# Patient Record
Sex: Female | Born: 1960
Health system: Southern US, Community
[De-identification: ages and names within clinical notes are randomized; demographics above are authoritative.]

## PROBLEM LIST (undated history)

## (undated) DIAGNOSIS — R251 Tremor, unspecified: Secondary | ICD-10-CM

## (undated) DIAGNOSIS — K219 Gastro-esophageal reflux disease without esophagitis: Secondary | ICD-10-CM

## (undated) DIAGNOSIS — E7800 Pure hypercholesterolemia, unspecified: Secondary | ICD-10-CM

## (undated) DIAGNOSIS — Z860100 Personal history of colon polyps, unspecified: Secondary | ICD-10-CM

## (undated) DIAGNOSIS — E559 Vitamin D deficiency, unspecified: Secondary | ICD-10-CM

## (undated) DIAGNOSIS — D649 Anemia, unspecified: Secondary | ICD-10-CM

## (undated) DIAGNOSIS — J301 Allergic rhinitis due to pollen: Secondary | ICD-10-CM

## (undated) DIAGNOSIS — M1990 Unspecified osteoarthritis, unspecified site: Secondary | ICD-10-CM

## (undated) DIAGNOSIS — R011 Cardiac murmur, unspecified: Secondary | ICD-10-CM

## (undated) DIAGNOSIS — E611 Iron deficiency: Secondary | ICD-10-CM

## (undated) HISTORY — DX: Tremor, unspecified: R25.1

## (undated) HISTORY — DX: Anemia, unspecified: D64.9

## (undated) HISTORY — DX: Personal history of colonic polyps: Z86.010

## (undated) HISTORY — DX: Gastro-esophageal reflux disease without esophagitis: K21.9

## (undated) HISTORY — DX: Allergic rhinitis due to pollen: J30.1

## (undated) HISTORY — PX: COLONOSCOPY W/ POLYPECTOMY: SHX1380

## (undated) HISTORY — DX: Vitamin D deficiency, unspecified: E55.9

## (undated) HISTORY — PX: COLONOSCOPY: SHX174

## (undated) HISTORY — DX: Pure hypercholesterolemia, unspecified: E78.00

## (undated) HISTORY — DX: Iron deficiency: E61.1

## (undated) HISTORY — DX: Personal history of colon polyps, unspecified: Z86.0100

---

## 2004-10-04 ENCOUNTER — Ambulatory Visit (HOSPITAL_BASED_OUTPATIENT_CLINIC_OR_DEPARTMENT_OTHER): Admission: RE | Admit: 2004-10-04 | Discharge: 2004-10-04 | Payer: Self-pay | Admitting: Plastic Surgery

## 2016-02-09 DIAGNOSIS — H8113 Benign paroxysmal vertigo, bilateral: Secondary | ICD-10-CM | POA: Diagnosis not present

## 2016-04-04 DIAGNOSIS — Z6821 Body mass index (BMI) 21.0-21.9, adult: Secondary | ICD-10-CM | POA: Diagnosis not present

## 2016-04-04 DIAGNOSIS — Z124 Encounter for screening for malignant neoplasm of cervix: Secondary | ICD-10-CM | POA: Diagnosis not present

## 2016-04-04 DIAGNOSIS — Z01419 Encounter for gynecological examination (general) (routine) without abnormal findings: Secondary | ICD-10-CM | POA: Diagnosis not present

## 2016-07-19 DIAGNOSIS — D485 Neoplasm of uncertain behavior of skin: Secondary | ICD-10-CM | POA: Diagnosis not present

## 2016-07-19 DIAGNOSIS — D225 Melanocytic nevi of trunk: Secondary | ICD-10-CM | POA: Diagnosis not present

## 2016-07-19 DIAGNOSIS — L820 Inflamed seborrheic keratosis: Secondary | ICD-10-CM | POA: Diagnosis not present

## 2016-07-19 DIAGNOSIS — L918 Other hypertrophic disorders of the skin: Secondary | ICD-10-CM | POA: Diagnosis not present

## 2016-10-07 DIAGNOSIS — H5203 Hypermetropia, bilateral: Secondary | ICD-10-CM | POA: Diagnosis not present

## 2016-10-16 DIAGNOSIS — Z1231 Encounter for screening mammogram for malignant neoplasm of breast: Secondary | ICD-10-CM | POA: Diagnosis not present

## 2016-10-16 DIAGNOSIS — Z803 Family history of malignant neoplasm of breast: Secondary | ICD-10-CM | POA: Diagnosis not present

## 2016-10-31 DIAGNOSIS — H43813 Vitreous degeneration, bilateral: Secondary | ICD-10-CM | POA: Diagnosis not present

## 2016-10-31 DIAGNOSIS — D3131 Benign neoplasm of right choroid: Secondary | ICD-10-CM | POA: Diagnosis not present

## 2016-10-31 DIAGNOSIS — H35361 Drusen (degenerative) of macula, right eye: Secondary | ICD-10-CM | POA: Diagnosis not present

## 2016-11-20 DIAGNOSIS — Z23 Encounter for immunization: Secondary | ICD-10-CM | POA: Diagnosis not present

## 2016-11-21 DIAGNOSIS — D229 Melanocytic nevi, unspecified: Secondary | ICD-10-CM | POA: Diagnosis not present

## 2016-11-21 DIAGNOSIS — L821 Other seborrheic keratosis: Secondary | ICD-10-CM | POA: Diagnosis not present

## 2016-11-21 DIAGNOSIS — L814 Other melanin hyperpigmentation: Secondary | ICD-10-CM | POA: Diagnosis not present

## 2017-02-20 DIAGNOSIS — Z0000 Encounter for general adult medical examination without abnormal findings: Secondary | ICD-10-CM | POA: Diagnosis not present

## 2017-02-20 DIAGNOSIS — R42 Dizziness and giddiness: Secondary | ICD-10-CM | POA: Diagnosis not present

## 2017-02-20 DIAGNOSIS — Z136 Encounter for screening for cardiovascular disorders: Secondary | ICD-10-CM | POA: Diagnosis not present

## 2017-02-20 DIAGNOSIS — E559 Vitamin D deficiency, unspecified: Secondary | ICD-10-CM | POA: Diagnosis not present

## 2017-02-24 ENCOUNTER — Telehealth (HOSPITAL_COMMUNITY): Payer: Self-pay | Admitting: Family Medicine

## 2017-02-24 ENCOUNTER — Other Ambulatory Visit: Payer: Self-pay | Admitting: Family Medicine

## 2017-02-24 DIAGNOSIS — R011 Cardiac murmur, unspecified: Secondary | ICD-10-CM

## 2017-02-25 NOTE — Telephone Encounter (Signed)
User: Roberta Caldwell A Date/time: 02/24/17 3:17 PM  Comment: Called pt and lmsg for her to CB to sch echo..  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 type: Telephone Call type: Outgoing  Contact: Meridee Score D Relation to patient: Self

## 2017-02-27 ENCOUNTER — Other Ambulatory Visit: Payer: Self-pay

## 2017-02-27 ENCOUNTER — Encounter (INDEPENDENT_AMBULATORY_CARE_PROVIDER_SITE_OTHER): Payer: Self-pay

## 2017-02-27 ENCOUNTER — Ambulatory Visit (HOSPITAL_COMMUNITY): Payer: 59 | Attending: Cardiology

## 2017-02-27 DIAGNOSIS — R011 Cardiac murmur, unspecified: Secondary | ICD-10-CM | POA: Insufficient documentation

## 2017-03-13 DIAGNOSIS — H40053 Ocular hypertension, bilateral: Secondary | ICD-10-CM | POA: Diagnosis not present

## 2017-03-13 DIAGNOSIS — H40012 Open angle with borderline findings, low risk, left eye: Secondary | ICD-10-CM | POA: Diagnosis not present

## 2017-03-13 DIAGNOSIS — H21553 Recession of chamber angle, bilateral: Secondary | ICD-10-CM | POA: Diagnosis not present

## 2017-03-13 DIAGNOSIS — H40013 Open angle with borderline findings, low risk, bilateral: Secondary | ICD-10-CM | POA: Diagnosis not present

## 2017-04-22 DIAGNOSIS — Z6821 Body mass index (BMI) 21.0-21.9, adult: Secondary | ICD-10-CM | POA: Diagnosis not present

## 2017-04-22 DIAGNOSIS — Z01419 Encounter for gynecological examination (general) (routine) without abnormal findings: Secondary | ICD-10-CM | POA: Diagnosis not present

## 2017-04-22 DIAGNOSIS — Z124 Encounter for screening for malignant neoplasm of cervix: Secondary | ICD-10-CM | POA: Diagnosis not present

## 2017-05-01 DIAGNOSIS — D3131 Benign neoplasm of right choroid: Secondary | ICD-10-CM | POA: Diagnosis not present

## 2017-05-01 DIAGNOSIS — D3132 Benign neoplasm of left choroid: Secondary | ICD-10-CM | POA: Diagnosis not present

## 2017-05-01 DIAGNOSIS — H43813 Vitreous degeneration, bilateral: Secondary | ICD-10-CM | POA: Diagnosis not present

## 2017-05-12 DIAGNOSIS — J029 Acute pharyngitis, unspecified: Secondary | ICD-10-CM | POA: Diagnosis not present

## 2017-05-12 DIAGNOSIS — J3489 Other specified disorders of nose and nasal sinuses: Secondary | ICD-10-CM | POA: Diagnosis not present

## 2017-10-09 DIAGNOSIS — L814 Other melanin hyperpigmentation: Secondary | ICD-10-CM | POA: Diagnosis not present

## 2017-10-09 DIAGNOSIS — D229 Melanocytic nevi, unspecified: Secondary | ICD-10-CM | POA: Diagnosis not present

## 2017-10-09 DIAGNOSIS — D1801 Hemangioma of skin and subcutaneous tissue: Secondary | ICD-10-CM | POA: Diagnosis not present

## 2017-10-22 DIAGNOSIS — Z1231 Encounter for screening mammogram for malignant neoplasm of breast: Secondary | ICD-10-CM | POA: Diagnosis not present

## 2018-01-01 DIAGNOSIS — D3132 Benign neoplasm of left choroid: Secondary | ICD-10-CM | POA: Diagnosis not present

## 2018-01-01 DIAGNOSIS — H5203 Hypermetropia, bilateral: Secondary | ICD-10-CM | POA: Diagnosis not present

## 2018-02-25 DIAGNOSIS — E7889 Other lipoprotein metabolism disorders: Secondary | ICD-10-CM | POA: Diagnosis not present

## 2018-02-25 DIAGNOSIS — Z0000 Encounter for general adult medical examination without abnormal findings: Secondary | ICD-10-CM | POA: Diagnosis not present

## 2018-12-29 ENCOUNTER — Encounter (HOSPITAL_COMMUNITY): Payer: Self-pay

## 2018-12-29 ENCOUNTER — Emergency Department (HOSPITAL_COMMUNITY): Payer: 59

## 2018-12-29 ENCOUNTER — Emergency Department (HOSPITAL_COMMUNITY)
Admission: EM | Admit: 2018-12-29 | Discharge: 2018-12-30 | Disposition: A | Discharge status: Home / Self Care | Payer: 59 | Attending: Emergency Medicine | Admitting: Emergency Medicine

## 2018-12-29 DIAGNOSIS — W540XXA Bitten by dog, initial encounter: Secondary | ICD-10-CM | POA: Diagnosis not present

## 2018-12-29 DIAGNOSIS — Y929 Unspecified place or not applicable: Secondary | ICD-10-CM | POA: Insufficient documentation

## 2018-12-29 DIAGNOSIS — Y999 Unspecified external cause status: Secondary | ICD-10-CM | POA: Insufficient documentation

## 2018-12-29 DIAGNOSIS — S68613A Complete traumatic transphalangeal amputation of left middle finger, initial encounter: Secondary | ICD-10-CM | POA: Diagnosis not present

## 2018-12-29 DIAGNOSIS — S68113A Complete traumatic metacarpophalangeal amputation of left middle finger, initial encounter: Secondary | ICD-10-CM

## 2018-12-29 DIAGNOSIS — Y9389 Activity, other specified: Secondary | ICD-10-CM | POA: Insufficient documentation

## 2018-12-29 DIAGNOSIS — S6992XA Unspecified injury of left wrist, hand and finger(s), initial encounter: Secondary | ICD-10-CM | POA: Diagnosis present

## 2018-12-29 NOTE — ED Triage Notes (Signed)
Pt states that her dog bit her L middle finger, amputation of tip of finger, bleeding controlled, dog is up to date of shots, last tetanus unknown

## 2018-12-30 MED ORDER — SODIUM CHLORIDE 0.9 % IV SOLN
3.0000 g | Freq: Once | INTRAVENOUS | Status: AC
  Administered 2018-12-30: 3 g via INTRAVENOUS
  Filled 2018-12-30: qty 3

## 2018-12-30 MED ORDER — AMOXICILLIN-POT CLAVULANATE 875-125 MG PO TABS: 1.0000 | ORAL_TABLET | Freq: Two times a day (BID) | ORAL | 0 refills | Status: DC

## 2018-12-30 MED ORDER — AMOXICILLIN-POT CLAVULANATE 875-125 MG PO TABS
1.0000 | ORAL_TABLET | Freq: Once | ORAL | Status: AC
  Administered 2018-12-30: 1 via ORAL
  Filled 2018-12-30: qty 1

## 2018-12-30 MED ORDER — OXYCODONE-ACETAMINOPHEN 5-325 MG PO TABS: 1.0000 | ORAL_TABLET | ORAL | 0 refills | Status: DC | PRN

## 2018-12-30 MED ORDER — BUPIVACAINE HCL (PF) 0.5 % IJ SOLN
10.0000 mL | Freq: Once | INTRAMUSCULAR | Status: AC
  Administered 2018-12-30: 10 mL
  Filled 2018-12-30: qty 10

## 2018-12-30 NOTE — Consult Note (Signed)
Reason for Consult: Left middle finger dog bite with amputation and exposed bone  referring Physician: ER staff  Roberta Caldwell is an 58 y.o. female.  HPI: This is a pleasant 58 year old female who was injured by her golden retriever.  The golden retriever bit the end of her finger and the left middle finger now has an amputation with exposed bone.  The patient was injured last night December 29, 2018.  She checked into the ER at 9 PM.  I was unfortunately not called until 5 AM and thus there is a significant delay from injury to treatment the patient has her finger wrapped I reviewed this.  I discussed her care with the ER physicians and requested Unasyn to be given immediately 3 g.  Once I was called at 5 AM I immediately came to her bedside.  Patient has an open fracture and a guillotine type amputation about the left middle finger secondary to a canine bite  No past medical history on file.  No past surgical history on file.  No family history on file.  Social History:  has no history on file for tobacco, alcohol, and drug.  Allergies:  Allergies  Allergen Reactions  . Sulfa Antibiotics Rash     No results found for this or any previous visit (from the past 48 hour(s)).  Dg Finger Middle Left  Result Date: 12/29/2018 CLINICAL DATA:  58 year female with dog bite. EXAM: LEFT MIDDLE FINGER 2+V COMPARISON:  None. FINDINGS: There is fracture of the mid to distal aspect of the distal phalanx of the third digit. There is fracture or amputation of the tuft of the distal phalanx of the third digit. There is no dislocation. There is soft tissue defect and laceration of the tip of the third digit with small pockets of air. No radiopaque foreign object. IMPRESSION: 1. Fractures of the distal phalanx of the third digit as described. No dislocation. 2. Soft tissue defect and laceration of the tip of the third digit. Electronically Signed   By: Anner Crete M.D.   On: 12/29/2018 21:21     Review of Systems  Respiratory: Negative.   Cardiovascular: Negative.   Gastrointestinal: Negative.   Genitourinary: Negative.    There were no vitals taken for this visit. Physical Exam Left middle finger open fracture secondary to a canine bite about the extremity.  I discussed these issues with her at length and the findings.  She does not have any signs of obvious infection at present time but I do worry about this significantly given the timeframe duration from injury to presentation.  She does not have any evidence of instability symptoms at present juncture.  X-rays show a fracture of the distal phalanx and loss of bone I reviewed these in detail.  The patient is alert and oriented in no acute distress. The patient complains of pain in the affected upper extremity.  The patient is noted to have a normal HEENT exam. Lung fields show equal chest expansion and no shortness of breath. Abdomen exam is nontender without distention. Lower extremity examination does not show any fracture dislocation or blood clot symptoms. Pelvis is stable and the neck and back are stable and nontender. Assessment/Plan:  I have consented the patient for irrigation debridement and surgical evaluation.  Procedure: Patient was given a intermetacarpal block following this prepped and draped in usual sterile fashion with 3 separate Betadine scrubs.  I performed irrigation debridement of skin subtenons tissue and bone.  This was  a irrigation debridement of an open fracture.  I was very aggressive with 4 L of saline and 3 separate Betadine scrubs.  Following this I performed hemostasis performance of the area and left the area open.  Given the significant injury process I feel that the best course is a surgical irrigation and debridement and return to the operative theater and 4872 hours for definitive closure and repeat I&D.  This will be a revision amputation to the affected finger.  She tolerated the  procedure well.  There were no complications.  I will see her back in the office postoperatively but will book her for surgery this Saturday.  Augmentin 875 twice daily and appropriate pain medicine.  Elevate and keep the area clean and dry.  She will notify me should any problems occur.  Pleasure to see her today and participate in her surgical care plan.  Once again she was given IV Unasyn as well as Augmentin as instructed  Should any problems occur she will notify us  [place] III 12/30/2018, 5:16 PM

## 2018-12-30 NOTE — ED Notes (Signed)
Pt verbalized understanding of discharge paperwork, prescriptions and follow-up care 

## 2018-12-30 NOTE — ED Notes (Signed)
Patient verbalizes understanding of discharge instructions. Opportunity for questioning and answers were provided. Armband removed by staff, pt discharged from ED.  

## 2018-12-30 NOTE — Discharge Instructions (Addendum)
Keep your hand elevated as much as possible.  If you need additional pain relief, you may supplement with ibuprofen.  Follow up with Dr. Amedeo Plenty.

## 2018-12-30 NOTE — ED Provider Notes (Signed)
[facility] EMERGENCY DEPARTMENT Provider Note   CSN: [id number] Arrival date & time: 12/29/18  1957    History   Chief Complaint Chief Complaint  Patient presents with  . Animal Bite    HPI Roberta Caldwell is a 58 y.o. female.   The history is provided by the patient.  Animal Bite She suffered a dog bite to the tip of her left long finger and a piece of the finger was bitten off.  She was trying to remove some matted hair by the dog's mouth when the dog bit her.  Dog is up-to-date on its immunizations, and patient states that her PCP keeps her up-to-date on all necessary immunizations.  History reviewed. No pertinent past medical history.  There are no active problems to display for this patient.   History reviewed. No pertinent surgical history.   OB History   No obstetric history on file.      Home Medications    Prior to Admission medications   Not on File    Family History No family history on file.  Social History Social History   Tobacco Use  . Smoking status: Not on file  Substance Use Topics  . Alcohol use: Not on file  . Drug use: Not on file     Allergies   Sulfa antibiotics   Review of Systems Review of Systems  All other systems reviewed and are negative.    Physical Exam Updated Vital Signs BP 126/84 (BP Location: Right Arm)   Pulse 72   Temp 98.3 F (36.8 C) (Oral)   Resp 16   Ht 5\' 3"  (1.6 m)   Wt 54.4 kg   SpO2 98%   BMI 21.26 kg/m   Physical Exam Vitals signs and nursing note reviewed.    58 year old female, resting comfortably and in no acute distress. Vital signs are normal. Oxygen saturation is 98%, which is normal. Head is normocephalic and atraumatic. PERRLA, EOMI. Oropharynx is clear. Neck is nontender and supple without adenopathy or JVD. Back is nontender and there is no CVA tenderness. Lungs are clear without rales, wheezes, or rhonchi. Chest is nontender. Heart has regular rate and  rhythm without murmur. Abdomen is soft, flat, nontender without masses or hepatosplenomegaly and peristalsis is normoactive. Extremities: Avulsion of the tip and nail of the left long finger.  Remainder of extremity exam is unremarkable.   Skin is warm and dry without rash. Neurologic: Mental status is normal, cranial nerves are intact, there are no motor or sensory deficits.  ED Treatments / Results   Radiology Dg Finger Middle Left  Result Date: 12/29/2018 CLINICAL DATA:  58 year female with dog bite. EXAM: LEFT MIDDLE FINGER 2+V COMPARISON:  None. FINDINGS: There is fracture of the mid to distal aspect of the distal phalanx of the third digit. There is fracture or amputation of the tuft of the distal phalanx of the third digit. There is no dislocation. There is soft tissue defect and laceration of the tip of the third digit with small pockets of air. No radiopaque foreign object. IMPRESSION: 1. Fractures of the distal phalanx of the third digit as described. No dislocation. 2. Soft tissue defect and laceration of the tip of the third digit. Electronically Signed   By: Anner Crete M.D.   On: 12/29/2018 21:21    Procedures .Nerve Block  Date/Time: 12/30/2018 5:19 AM Performed by: Delora Fuel, MD Authorized by: Delora Fuel, MD   Consent:  Consent obtained:  Verbal   Consent given by:  Patient   Risks discussed:  Allergic reaction, infection, nerve damage, swelling, pain and unsuccessful block   Alternatives discussed:  Alternative treatment Indications:    Indications:  Procedural anesthesia Location:    Body area:  Upper extremity   Upper extremity nerve:  Metacarpal   Laterality:  Left Pre-procedure details:    Skin preparation:  Alcohol   Preparation: Patient was prepped and draped in usual sterile fashion   Skin anesthesia (see MAR for exact dosages):    Skin anesthesia method:  None Procedure details (see MAR for exact dosages):    Block needle gauge:  25  G   Anesthetic injected:  Bupivacaine 0.5% w/o epi   Steroid injected:  None   Additive injected:  None   Injection procedure:  Anatomic landmarks identified, incremental injection, negative aspiration for blood, anatomic landmarks palpated and introduced needle   Paresthesia:  None Post-procedure details:    Dressing:  None   Outcome:  Anesthesia achieved   Patient tolerance of procedure:  Tolerated well, no immediate complications     Medications Ordered in ED Medications  amoxicillin-clavulanate (AUGMENTIN) 875-125 MG per tablet 1 tablet (1 tablet Oral Given 12/30/18 0458)  Ampicillin-Sulbactam (UNASYN) 3 g in sodium chloride 0.9 % 100 mL IVPB (0 g Intravenous Stopped 12/30/18 0619)  bupivacaine (MARCAINE) 0.5 % injection 10 mL (10 mLs Infiltration Given by Other 12/30/18 0515)     Initial Impression / Assessment and Plan / ED Course  I have reviewed the triage vital signs and the nursing notes.  Pertinent imaging results that were available during my care of the patient were reviewed by me and considered in my medical decision making (see chart for details).  Avulsion of the fingertip and nail of the left long finger from a dog bite.  X-rays are obtained showing loss of a portion of the tuft and fracture of the distal phalanx.  She is given a dose of amoxicillin-clavulanic acid.  Consultation is obtained with Dr. Amedeo Plenty of hand surgery service.  He requests digital block to be placed with bupivacaine and patient be given a dose of amoxicillin-sulbactam and he will come to the ED for debridement and plans for definitive closure later.  Digital block is placed.  Dr. Amedeo Plenty has evaluated the patient and done initial care and will make arrangements for definitive treatment as an outpatient.  She is discharged with prescriptions for oxycodone-acetaminophen and amoxicillin-clavulanic acid.  Final Clinical Impressions(s) / ED Diagnoses   Final diagnoses:  Traumatic amputation of tip of  left middle finger, initial encounter  Dog bite, initial encounter    ED Discharge Orders         Ordered    amoxicillin-clavulanate (AUGMENTIN) 875-125 MG tablet  2 times daily     12/30/18 0645    oxyCODONE-acetaminophen (PERCOCET) 5-325 MG tablet  Every 4 hours PRN     12/30/18 [id number]           Delora Fuel, MD [phone]

## 2018-12-31 ENCOUNTER — Encounter (HOSPITAL_COMMUNITY): Payer: Self-pay | Admitting: *Deleted

## 2018-12-31 ENCOUNTER — Other Ambulatory Visit (HOSPITAL_COMMUNITY)
Admission: RE | Admit: 2018-12-31 | Discharge: 2018-12-31 | Disposition: A | Discharge status: Home / Self Care | Payer: 59 | Source: Ambulatory Visit | Attending: Orthopedic Surgery | Admitting: Orthopedic Surgery

## 2018-12-31 ENCOUNTER — Other Ambulatory Visit: Payer: Self-pay

## 2018-12-31 DIAGNOSIS — Z01812 Encounter for preprocedural laboratory examination: Secondary | ICD-10-CM | POA: Insufficient documentation

## 2018-12-31 DIAGNOSIS — Z20828 Contact with and (suspected) exposure to other viral communicable diseases: Secondary | ICD-10-CM | POA: Diagnosis not present

## 2018-12-31 LAB — SARS CORONAVIRUS 2 (TAT 6-24 HRS): SARS Coronavirus 2: NEGATIVE

## 2018-12-31 NOTE — Progress Notes (Signed)
Roberta Caldwell denies chest pain or shortness of breath. Patient denies any s/s or known exposure to Covid. Roberta Caldwell was tested for Covid today and is in quarantine with her husband. I instructed patient to not eat after midnight Friday night, but may have clear liquids until 0545.  Roberta Caldwell reports that she drink water, grape juice and water, I told her they are all approved.  I instructed patient to go to the Emergency to register Sat AM.

## 2019-01-01 MED ORDER — SODIUM CHLORIDE 0.9 % IV SOLN
3.0000 g | INTRAVENOUS | Status: AC
  Administered 2019-01-02: 3 g via INTRAVENOUS
  Filled 2019-01-01: qty 8

## 2019-01-01 NOTE — Progress Notes (Signed)
Mrs Riedinger called this am, she reports that she called [facility] because her hand is swelling and she had has some nausea, no vomiting. "I stopped taking my pain medication and I feel a little better today. Patient denies fever, cough or any other symptoms. I asked what she was instructed to do about the  Hand swelling, " just keep it up." I informed patient that her Covid test was negative, to call us back if she devolops a fever or starts vomiting.

## 2019-01-01 NOTE — H&P (Signed)
Please see note from Wednesday morning regarding her presentation and injury process.  We are planning to proceed with surgical algorithm of care today to include irrigation debridement and revision amputation about the affected finger.  All questions have been addressed.  We are planning surgery for your upper extremity. The risk and benefits of surgery to include risk of bleeding, infection, anesthesia,  damage to normal structures and failure of the surgery to accomplish its intended goals of relieving symptoms and restoring function have been discussed in detail. With this in mind we plan to proceed. I have specifically discussed with the patient the pre-and postoperative regime and the dos and don'ts and risk and benefits in great detail. Risk and benefits of surgery also include risk of dystrophy(CRPS), chronic nerve pain, failure of the healing process to go onto completion and other inherent risks of surgery The relavent the pathophysiology of the disease/injury process, as well as the alternatives for treatment and postoperative course of action has been discussed in great detail with the patient who desires to proceed.  We will do everything in our power to help you (the patient) restore function to the upper extremity. It is a pleasure to see this patient today. Carolee Channell MD

## 2019-01-02 ENCOUNTER — Ambulatory Visit (HOSPITAL_COMMUNITY): Payer: 59 | Admitting: Anesthesiology

## 2019-01-02 ENCOUNTER — Encounter (HOSPITAL_COMMUNITY)
Admission: RE | Disposition: A | Discharge status: Home / Self Care | Payer: Self-pay | Source: Home / Self Care | Attending: Orthopedic Surgery

## 2019-01-02 ENCOUNTER — Encounter (HOSPITAL_COMMUNITY): Payer: Self-pay | Admitting: *Deleted

## 2019-01-02 ENCOUNTER — Other Ambulatory Visit: Payer: Self-pay

## 2019-01-02 ENCOUNTER — Ambulatory Visit (HOSPITAL_COMMUNITY)
Admission: RE | Admit: 2019-01-02 | Discharge: 2019-01-02 | Disposition: A | Discharge status: Home / Self Care | Payer: 59 | Attending: Orthopedic Surgery | Admitting: Orthopedic Surgery

## 2019-01-02 DIAGNOSIS — Z882 Allergy status to sulfonamides status: Secondary | ICD-10-CM | POA: Diagnosis not present

## 2019-01-02 DIAGNOSIS — W540XXA Bitten by dog, initial encounter: Secondary | ICD-10-CM | POA: Diagnosis not present

## 2019-01-02 DIAGNOSIS — S68123A Partial traumatic metacarpophalangeal amputation of left middle finger, initial encounter: Secondary | ICD-10-CM | POA: Diagnosis present

## 2019-01-02 DIAGNOSIS — M1990 Unspecified osteoarthritis, unspecified site: Secondary | ICD-10-CM | POA: Diagnosis not present

## 2019-01-02 HISTORY — PX: I & D EXTREMITY: SHX5045

## 2019-01-02 HISTORY — DX: Cardiac murmur, unspecified: R01.1

## 2019-01-02 HISTORY — DX: Unspecified osteoarthritis, unspecified site: M19.90

## 2019-01-02 SURGERY — IRRIGATION AND DEBRIDEMENT EXTREMITY
Anesthesia: Monitor Anesthesia Care | Site: Middle Finger | Laterality: Left

## 2019-01-02 MED ORDER — LIDOCAINE 2% (20 MG/ML) 5 ML SYRINGE
INTRAMUSCULAR | Status: AC
  Filled 2019-01-02: qty 5

## 2019-01-02 MED ORDER — MIDAZOLAM HCL 5 MG/5ML IJ SOLN
INTRAMUSCULAR | Status: DC | PRN
  Administered 2019-01-02 (×2): 1 mg via INTRAVENOUS

## 2019-01-02 MED ORDER — FENTANYL CITRATE (PF) 100 MCG/2ML IJ SOLN
INTRAMUSCULAR | Status: DC | PRN
  Administered 2019-01-02 (×2): 25 ug via INTRAVENOUS

## 2019-01-02 MED ORDER — BUPIVACAINE HCL (PF) 0.25 % IJ SOLN
INTRAMUSCULAR | Status: AC
  Filled 2019-01-02: qty 30

## 2019-01-02 MED ORDER — ACETAMINOPHEN 10 MG/ML IV SOLN: 1000.0000 mg | Freq: Once | INTRAVENOUS | Status: DC | PRN

## 2019-01-02 MED ORDER — BUPIVACAINE HCL (PF) 0.25 % IJ SOLN
INTRAMUSCULAR | Status: DC | PRN
  Administered 2019-01-02: 5 mL

## 2019-01-02 MED ORDER — PROPOFOL 500 MG/50ML IV EMUL
INTRAVENOUS | Status: DC | PRN
  Administered 2019-01-02: 50 ug/kg/min via INTRAVENOUS

## 2019-01-02 MED ORDER — LACTATED RINGERS IV SOLN
INTRAVENOUS | Status: DC
  Administered 2019-01-02: 07:00:00 via INTRAVENOUS

## 2019-01-02 MED ORDER — MEPERIDINE HCL 25 MG/ML IJ SOLN: 6.2500 mg | INTRAMUSCULAR | Status: DC | PRN

## 2019-01-02 MED ORDER — OXYCODONE HCL 5 MG PO TABS: 5.0000 mg | ORAL_TABLET | Freq: Once | ORAL | Status: DC | PRN

## 2019-01-02 MED ORDER — DEXAMETHASONE SODIUM PHOSPHATE 10 MG/ML IJ SOLN
INTRAMUSCULAR | Status: AC
  Filled 2019-01-02: qty 1

## 2019-01-02 MED ORDER — FENTANYL CITRATE (PF) 100 MCG/2ML IJ SOLN: 25.0000 ug | INTRAMUSCULAR | Status: DC | PRN

## 2019-01-02 MED ORDER — PROMETHAZINE HCL 25 MG/ML IJ SOLN: 6.2500 mg | INTRAMUSCULAR | Status: DC | PRN

## 2019-01-02 MED ORDER — ACETAMINOPHEN 325 MG PO TABS: 325.0000 mg | ORAL_TABLET | Freq: Once | ORAL | Status: DC | PRN

## 2019-01-02 MED ORDER — CHLORHEXIDINE GLUCONATE 4 % EX LIQD: 60.0000 mL | Freq: Once | CUTANEOUS | Status: DC

## 2019-01-02 MED ORDER — ONDANSETRON HCL 4 MG/2ML IJ SOLN
INTRAMUSCULAR | Status: AC
  Filled 2019-01-02: qty 2

## 2019-01-02 MED ORDER — LIDOCAINE HCL (PF) 1 % IJ SOLN
INTRAMUSCULAR | Status: DC | PRN
  Administered 2019-01-02: 5 mL

## 2019-01-02 MED ORDER — ONDANSETRON HCL 4 MG/2ML IJ SOLN
INTRAMUSCULAR | Status: DC | PRN
  Administered 2019-01-02: 4 mg via INTRAVENOUS

## 2019-01-02 MED ORDER — FENTANYL CITRATE (PF) 250 MCG/5ML IJ SOLN
INTRAMUSCULAR | Status: AC
  Filled 2019-01-02: qty 5

## 2019-01-02 MED ORDER — ACETAMINOPHEN 160 MG/5ML PO SOLN: 325.0000 mg | Freq: Once | ORAL | Status: DC | PRN

## 2019-01-02 MED ORDER — SODIUM CHLORIDE 0.9 % IR SOLN
Status: DC | PRN
  Administered 2019-01-02: 3000 mL

## 2019-01-02 MED ORDER — LACTATED RINGERS IV SOLN: INTRAVENOUS | Status: DC

## 2019-01-02 MED ORDER — LIDOCAINE HCL (PF) 1 % IJ SOLN
INTRAMUSCULAR | Status: AC
  Filled 2019-01-02: qty 30

## 2019-01-02 MED ORDER — OXYCODONE HCL 5 MG/5ML PO SOLN: 5.0000 mg | Freq: Once | ORAL | Status: DC | PRN

## 2019-01-02 MED ORDER — MIDAZOLAM HCL 2 MG/2ML IJ SOLN
INTRAMUSCULAR | Status: AC
  Filled 2019-01-02: qty 2

## 2019-01-02 MED ORDER — 0.9 % SODIUM CHLORIDE (POUR BTL) OPTIME
TOPICAL | Status: DC | PRN
  Administered 2019-01-02: 1000 mL

## 2019-01-02 MED ORDER — LIDOCAINE 2% (20 MG/ML) 5 ML SYRINGE
INTRAMUSCULAR | Status: DC | PRN
  Administered 2019-01-02: 40 mg via INTRAVENOUS

## 2019-01-02 MED ORDER — PROPOFOL 10 MG/ML IV BOLUS
INTRAVENOUS | Status: AC
  Filled 2019-01-02: qty 20

## 2019-01-02 SURGICAL SUPPLY — 49 items
BNDG COHESIVE 1X5 TAN STRL LF (GAUZE/BANDAGES/DRESSINGS) ×2 IMPLANT
BNDG COHESIVE 2X5 TAN STRL LF (GAUZE/BANDAGES/DRESSINGS) ×2 IMPLANT
BNDG CONFORM 2 STRL LF (GAUZE/BANDAGES/DRESSINGS) ×2 IMPLANT
BNDG ELASTIC 4X5.8 VLCR STR LF (GAUZE/BANDAGES/DRESSINGS) IMPLANT
BNDG GAUZE ELAST 4 BULKY (GAUZE/BANDAGES/DRESSINGS) IMPLANT
CORD BIPOLAR FORCEPS 12FT (ELECTRODE) ×2 IMPLANT
COVER SURGICAL LIGHT HANDLE (MISCELLANEOUS) ×2 IMPLANT
COVER WAND RF STERILE (DRAPES) ×2 IMPLANT
CUFF TOURN SGL QUICK 18X4 (TOURNIQUET CUFF) ×2 IMPLANT
CUFF TOURN SGL QUICK 24 (TOURNIQUET CUFF)
CUFF TRNQT CYL 24X4X16.5-23 (TOURNIQUET CUFF) IMPLANT
DRAIN PENROSE 1/4X12 LTX STRL (WOUND CARE) ×2 IMPLANT
DRAPE C-ARM MINI 42X72 WSTRAPS (DRAPES) ×2 IMPLANT
DRSG ADAPTIC 3X8 NADH LF (GAUZE/BANDAGES/DRESSINGS) IMPLANT
DRSG MEPITEL 3X4 ME34 (GAUZE/BANDAGES/DRESSINGS) ×2 IMPLANT
DRSG XEROFORM 1X8 (GAUZE/BANDAGES/DRESSINGS) ×2 IMPLANT
GAUZE SPONGE 4X4 12PLY STRL (GAUZE/BANDAGES/DRESSINGS) IMPLANT
GAUZE SPONGE 4X4 12PLY STRL LF (GAUZE/BANDAGES/DRESSINGS) ×2 IMPLANT
GAUZE XEROFORM 1X8 LF (GAUZE/BANDAGES/DRESSINGS) IMPLANT
GLOVE BIOGEL M 8.0 STRL (GLOVE) IMPLANT
GLOVE SS BIOGEL STRL SZ 8 (GLOVE) ×1 IMPLANT
GLOVE SUPERSENSE BIOGEL SZ 8 (GLOVE) ×1
GOWN STRL REUS W/ TWL LRG LVL3 (GOWN DISPOSABLE) IMPLANT
GOWN STRL REUS W/ TWL XL LVL3 (GOWN DISPOSABLE) ×2 IMPLANT
GOWN STRL REUS W/TWL LRG LVL3 (GOWN DISPOSABLE)
GOWN STRL REUS W/TWL XL LVL3 (GOWN DISPOSABLE) ×4
KIT BASIN OR (CUSTOM PROCEDURE TRAY) ×2 IMPLANT
KIT TURNOVER KIT B (KITS) ×2 IMPLANT
MANIFOLD NEPTUNE II (INSTRUMENTS) ×2 IMPLANT
NEEDLE HYPO 25GX1X1/2 BEV (NEEDLE) ×2 IMPLANT
NS IRRIG 1000ML POUR BTL (IV SOLUTION) ×2 IMPLANT
PACK ORTHO EXTREMITY (CUSTOM PROCEDURE TRAY) ×2 IMPLANT
PAD ARMBOARD 7.5X6 YLW CONV (MISCELLANEOUS) ×2 IMPLANT
PAD CAST 4YDX4 CTTN HI CHSV (CAST SUPPLIES) IMPLANT
PADDING CAST COTTON 4X4 STRL (CAST SUPPLIES)
SET CYSTO W/LG BORE CLAMP LF (SET/KITS/TRAYS/PACK) ×2 IMPLANT
SOL PREP POV-IOD 4OZ 10% (MISCELLANEOUS) ×2 IMPLANT
SPLINT FINGER (SOFTGOODS) ×2 IMPLANT
SPONGE LAP 4X18 RFD (DISPOSABLE) IMPLANT
SUT CHROMIC 5 0 P 3 (SUTURE) ×2 IMPLANT
SUT PROLENE 4 0 P 3 18 (SUTURE) ×2 IMPLANT
SUT PROLENE 5 0 P 3 (SUTURE) ×2 IMPLANT
SWAB CULTURE ESWAB REG 1ML (MISCELLANEOUS) IMPLANT
SYR CONTROL 10ML LL (SYRINGE) ×2 IMPLANT
TOWEL GREEN STERILE (TOWEL DISPOSABLE) ×2 IMPLANT
TOWEL GREEN STERILE FF (TOWEL DISPOSABLE) ×2 IMPLANT
TUBE CONNECTING 12X1/4 (SUCTIONS) ×2 IMPLANT
WATER STERILE IRR 1000ML POUR (IV SOLUTION) ×2 IMPLANT
YANKAUER SUCT BULB TIP NO VENT (SUCTIONS) ×2 IMPLANT

## 2019-01-02 NOTE — Transfer of Care (Signed)
Immediate Anesthesia Transfer of Care Note  Patient: Roberta Caldwell  Procedure(s) Performed: Irrigation and debridment with repair reconstruction left middle finger (Left Middle Finger)  Patient Location: PACU  Anesthesia Type:MAC  Level of Consciousness: awake, alert , oriented and patient cooperative  Airway & Oxygen Therapy: Patient Spontanous Breathing  Post-op Assessment: Report given to RN and Post -op Vital signs reviewed and stable  Post vital signs: Reviewed and stable  Last Vitals:  Vitals Value Taken Time  BP 125/79 01/02/19 1054  Temp    Pulse 73 01/02/19 1055  Resp 20 01/02/19 1055  SpO2 100 % 01/02/19 1055  Vitals shown include unvalidated device data.  Last Pain:  Vitals:   01/02/19 0710  TempSrc: Oral  PainSc: 0-No pain      Patients Stated Pain Goal: 5 (61/53/79 4327)  Complications: No apparent anesthesia complications

## 2019-01-02 NOTE — Op Note (Signed)
Operative note January 02, 2019  Roseanne Kaufman MD.  Preoperative diagnosis left middle finger dog bite with open amputation.  Patient presents for repeat irrigation debridement and reconstruction.  Postop diagnosis the same.  Operative procedure #1 irrigation debridement skin subtenons tissue bone nailbed and associated soft tissue structures.  This was an excisional debridement with curette knife and scissor 3 x 3 cm.  #2 revision amputation with volar advancement flap left middle finger #3 complex nailbed repair left middle finger #4 stress radiography/4 view radiographic series left middle finger  Surgeon Roseanne Kaufman  Anesthesia local with IV sedation  Estimated blood loss minimal  Description of procedure patient was seen by myself and anesthesia taken to the operative theater I performed a intermetacarpal/block anesthetic with Sensorcaine lidocaine without epinephrine to the finger region.  This was performed at the base of her palm.  She was then prepped and draped with 2 Hibiclens scrubs followed by 10-minute surgical Betadine scrub and paint.  Once this was complete we then performed irrigation and debridement of skin subtenons tissue bone nailbed and nail plate type tissue.  Any nonviable tissue was removed.  The bone was sculpted and 3 to 4 L of saline were placed through and through the area.  The excisional debridement was performed with curette knife and scissor.  Following this I performed a stress radiograph to make sure the fracture was stable and I did bevel a fracture cut/use rondure and bone biting instrument to level and secured the bone.  Following this I then performed a complex nailbed repair as I wanted to try and preserve the length of the finger.  Following complex nailbed repair under 4.5 loupe magnification with chromic suture we then performed a volar advancement flap.  The volar advancement flap was performed without difficulty and inset after skin sculpting  with 5-0 chromic and 5-0 Prolene suture.  The patient tolerated this well.  Tourniquet time was less than 30 minutes.  Estimated blood loss was minimal.  She had a pink warm finger at the conclusion of the procedure.  Dressing of Mepitel Adaptic Xeroform and splint as well as cushion gauze support was provided.  She was taken to recovery in stable condition.  She will continue Augmentin as outlined and pain management.  All questions have been addressed encouraged and answered.  Pleasure to see her today.  I will see her in 14 days in my office with therapy immediately following.  Levoy Geisen MD

## 2019-01-02 NOTE — Anesthesia Postprocedure Evaluation (Signed)
Anesthesia Post Note  Patient: Roberta Caldwell  Procedure(s) Performed: Irrigation and debridment with repair reconstruction left middle finger (Left Middle Finger)     Patient location during evaluation: PACU Anesthesia Type: MAC Level of consciousness: awake and alert Pain management: pain level controlled Vital Signs Assessment: post-procedure vital signs reviewed and stable Respiratory status: spontaneous breathing, nonlabored ventilation, respiratory function stable and patient connected to nasal cannula oxygen Cardiovascular status: stable and blood pressure returned to baseline Postop Assessment: no apparent nausea or vomiting Anesthetic complications: no    Last Vitals:  Vitals:   01/02/19 1054 01/02/19 1108  BP: 125/79 123/82  Pulse: 77 65  Resp: 20 19  Temp: 36.4 C 36.5 C  SpO2: 100% 100%    Last Pain:  Vitals:   01/02/19 1108  TempSrc:   PainSc: 0-No pain                 Effie Berkshire

## 2019-01-02 NOTE — Anesthesia Preprocedure Evaluation (Signed)
Anesthesia Evaluation  Patient identified by MRN, date of birth, ID band Patient awake    Reviewed: Allergy & Precautions, NPO status , Patient's Chart, lab work & pertinent test results  Airway Mallampati: I  TM Distance: >3 FB Neck ROM: Full    Dental  (+) Teeth Intact, Dental Advisory Given   Pulmonary neg pulmonary ROS,    breath sounds clear to auscultation       Cardiovascular negative cardio ROS   Rhythm:Regular Rate:Normal     Neuro/Psych negative neurological ROS  negative psych ROS   GI/Hepatic negative GI ROS, Neg liver ROS,   Endo/Other  negative endocrine ROS  Renal/GU negative Renal ROS     Musculoskeletal  (+) Arthritis ,   Abdominal Normal abdominal exam  (+)   Peds  Hematology negative hematology ROS (+)   Anesthesia Other Findings   Reproductive/Obstetrics                             Anesthesia Physical Anesthesia Plan  ASA: II  Anesthesia Plan: General   Post-op Pain Management:    Induction: Intravenous  PONV Risk Score and Plan: Ondansetron, Dexamethasone, Midazolam and Scopolamine patch - Pre-op  Airway Management Planned: LMA  Additional Equipment: None  Intra-op Plan:   Post-operative Plan: Extubation in OR  Informed Consent: I have reviewed the patients History and Physical, chart, labs and discussed the procedure including the risks, benefits and alternatives for the proposed anesthesia with the patient or authorized representative who has indicated his/her understanding and acceptance.     Dental advisory given  Plan Discussed with: CRNA  Anesthesia Plan Comments:         Anesthesia Quick Evaluation

## 2019-01-02 NOTE — Discharge Instructions (Signed)

## 2019-01-03 ENCOUNTER — Encounter (HOSPITAL_COMMUNITY): Payer: Self-pay | Admitting: Orthopedic Surgery

## 2019-05-20 ENCOUNTER — Ambulatory Visit: Payer: 59 | Attending: Internal Medicine

## 2019-05-20 DIAGNOSIS — Z23 Encounter for immunization: Secondary | ICD-10-CM

## 2019-05-20 NOTE — Progress Notes (Signed)
   Covid-19 Vaccination Clinic  Name:  Roberta Caldwell    MRN: [id number] DOB: 18-May-1960  05/20/2019  [name] was observed post Covid-19 immunization for 15 minutes without incident. She was provided with Vaccine Information Sheet and instruction to access the V-Safe system.   [name] was instructed to call 911 with any severe reactions post vaccine: Marland Kitchen Difficulty breathing  . Swelling of face and throat  . A fast heartbeat  . A bad rash all over body  . Dizziness and weakness   Immunizations Administered    Name Date Dose VIS Date Route   Pfizer COVID-19 Vaccine 05/20/2019  9:14 AM 0.3 mL 03/24/2018 Intramuscular   Manufacturer: [place]   Lot: [id number]   [place]: [id number]

## 2019-06-14 ENCOUNTER — Ambulatory Visit: Payer: 59

## 2019-06-14 ENCOUNTER — Ambulatory Visit: Payer: 59 | Attending: Internal Medicine

## 2019-06-14 DIAGNOSIS — Z23 Encounter for immunization: Secondary | ICD-10-CM

## 2019-06-14 NOTE — Progress Notes (Signed)
   Covid-19 Vaccination Clinic  Name:  Roberta Caldwell    MRN: [id number] DOB: [birth year]  06/14/2019  [name] was observed post Covid-19 immunization for 15 minutes without incident. She was provided with Vaccine Information Sheet and instruction to access the V-Safe system.   Ms. Railing was instructed to call 911 with any severe reactions post vaccine: Marland Kitchen Difficulty breathing  . Swelling of face and throat  . A fast heartbeat  . A bad rash all over body  . Dizziness and weakness   Immunizations Administered    Name Date Dose VIS Date Route   Pfizer COVID-19 Vaccine 06/14/2019  1:06 PM 0.3 mL 03/24/2018 Intramuscular   Manufacturer: [place]   Lot: [id number]   [place]: [id number]      Covid-19 Vaccination Clinic  Name:  Roberta Caldwell    MRN: [id number] DOB: 1960/02/25  06/14/2019  [name] was observed post Covid-19 immunization for 15 minutes without incident. She was provided with Vaccine Information Sheet and instruction to access the V-Safe system.   [name] was instructed to call 911 with any severe reactions post vaccine: Marland Kitchen Difficulty breathing  . Swelling of face and throat  . A fast heartbeat  . A bad rash all over body  . Dizziness and weakness   Immunizations Administered    Name Date Dose VIS Date Route   Pfizer COVID-19 Vaccine 06/14/2019  1:06 PM 0.3 mL 03/24/2018 Intramuscular   Manufacturer: [place]   Lot: [id number]   [place]: [id number]

## 2020-08-08 ENCOUNTER — Emergency Department (HOSPITAL_COMMUNITY)
Admission: EM | Admit: 2020-08-08 | Discharge: 2020-08-08 | Disposition: A | Discharge status: Home / Self Care | Payer: 59 | Attending: Emergency Medicine | Admitting: Emergency Medicine

## 2020-08-08 ENCOUNTER — Emergency Department (HOSPITAL_COMMUNITY): Payer: 59

## 2020-08-08 ENCOUNTER — Encounter (HOSPITAL_COMMUNITY): Payer: Self-pay

## 2020-08-08 ENCOUNTER — Other Ambulatory Visit: Payer: Self-pay

## 2020-08-08 DIAGNOSIS — U071 COVID-19: Secondary | ICD-10-CM | POA: Insufficient documentation

## 2020-08-08 DIAGNOSIS — K30 Functional dyspepsia: Secondary | ICD-10-CM

## 2020-08-08 DIAGNOSIS — R079 Chest pain, unspecified: Secondary | ICD-10-CM | POA: Diagnosis present

## 2020-08-08 DIAGNOSIS — R072 Precordial pain: Secondary | ICD-10-CM

## 2020-08-08 LAB — CBC
HCT: 34 % — ABNORMAL LOW (ref 36.0–46.0)
Hemoglobin: 10.8 g/dL — ABNORMAL LOW (ref 12.0–15.0)
MCH: 30.1 pg (ref 26.0–34.0)
MCHC: 31.8 g/dL (ref 30.0–36.0)
MCV: 94.7 fL (ref 80.0–100.0)
Platelets: 239 10*3/uL (ref 150–400)
RBC: 3.59 MIL/uL — ABNORMAL LOW (ref 3.87–5.11)
RDW: 13.9 % (ref 11.5–15.5)
WBC: 4.4 10*3/uL (ref 4.0–10.5)
nRBC: 0 % (ref 0.0–0.2)

## 2020-08-08 LAB — TROPONIN I (HIGH SENSITIVITY)
Troponin I (High Sensitivity): 21 ng/L — ABNORMAL HIGH (ref <18)
Troponin I (High Sensitivity): 23 ng/L — ABNORMAL HIGH (ref <18)

## 2020-08-08 LAB — BASIC METABOLIC PANEL
Anion gap: 7 (ref 5–15)
BUN: 14 mg/dL (ref 8–23)
CO2: 24 mmol/L (ref 22–32)
Calcium: 8.8 mg/dL — ABNORMAL LOW (ref 8.9–10.3)
Chloride: 103 mmol/L (ref 98–111)
Creatinine, Ser: 0.88 mg/dL (ref 0.44–1.00)
GFR, Estimated: >60 mL/min (ref >60)
Glucose, Bld: 117 mg/dL — ABNORMAL HIGH (ref 70–99)
Potassium: 3.7 mmol/L (ref 3.5–5.1)
Sodium: 134 mmol/L — ABNORMAL LOW (ref 135–145)

## 2020-08-08 LAB — RESP PANEL BY RT-PCR (FLU A&B, COVID) ARPGX2
Influenza A by PCR: NEGATIVE
Influenza B by PCR: NEGATIVE
SARS Coronavirus 2 by RT PCR: POSITIVE — AB

## 2020-08-08 MED ORDER — ACETAMINOPHEN 500 MG PO TABS
1000.0000 mg | ORAL_TABLET | Freq: Once | ORAL | Status: AC
  Administered 2020-08-08: 1000 mg via ORAL
  Filled 2020-08-08: qty 2

## 2020-08-08 MED ORDER — FAMOTIDINE 20 MG PO TABS
20.0000 mg | ORAL_TABLET | Freq: Once | ORAL | Status: AC
  Administered 2020-08-08: 20 mg via ORAL
  Filled 2020-08-08: qty 1

## 2020-08-08 MED ORDER — ALUM & MAG HYDROXIDE-SIMETH 200-200-20 MG/5ML PO SUSP
30.0000 mL | Freq: Once | ORAL | Status: AC
  Administered 2020-08-08: 30 mL via ORAL
  Filled 2020-08-08: qty 30

## 2020-08-08 MED ORDER — PANTOPRAZOLE SODIUM 40 MG PO TBEC: 40.0000 mg | DELAYED_RELEASE_TABLET | Freq: Every day | ORAL | 0 refills | Status: DC

## 2020-08-08 NOTE — Discharge Instructions (Addendum)
It was our pleasure to provide your ER care today - we hope that you feel better.  Take an enteric coated aspirin a day.  For gi symptoms, take protonix (acid blocker medication). You may also take pepcid/maalox for symptom relief.   Follow up with cardiologist in the next 1-2 weeks - call office to arrange appointment.   Return to ER if worse, new symptoms, recurrent or persistent chest pain, trouble breathing, or other concern.

## 2020-08-08 NOTE — ED Notes (Signed)
Husband called. He is in 5th day of Covid Positive. Please call him at [phone].

## 2020-08-08 NOTE — ED Provider Notes (Addendum)
Emergency Medicine Provider Triage Evaluation Note  Roberta Caldwell , a 60 y.o. female  was evaluated in triage.  Pt complains of chest pain.  Started having substernal chest pain at 2 AM that woke her from sleep, got up and TOOK a Pepcid and went back to sleep in the recliner but this morning pain worsened around 930.  This is the lowest pain she is ever had in her chest before.  Describes it as severe central chest pressure.  No associated lightheadedness or syncope.  No vomiting or diaphoresis.  No prior MI.  Received aspirin and nitro and now chest pain is relieved.  Did have a drop in blood pressure after nitro by EMS and received fluids.  Review of Systems  Positive: Chest pain Negative: Shortness of breath, syncope, fever, abdominal pain, vomiting  Physical Exam  BP 99/63   Pulse 92   Temp 100.2 F (37.9 C) (Oral)   Resp 16   SpO2 99%  Gen:   Awake, no distress   Resp:  Normal effort, CTA bilat Cardiac: RRR MSK:   Moves extremities without difficulty  Other:    Medical Decision Making  Medically screening exam initiated at 10:46 AM.  Appropriate orders placed.  Roberta Caldwell was informed that the remainder of the evaluation will be completed by another provider, this initial triage assessment does not replace that evaluation, and the importance of remaining in the ED until their evaluation is complete.         Jacqlyn Larsen, PA-C 08/08/20 1106    Carmin Muskrat, MD 08/13/20 [phone]

## 2020-08-08 NOTE — ED Provider Notes (Signed)
[facility] EMERGENCY DEPARTMENT Provider Note   CSN: [id number] Arrival date & time: 08/08/20  1033     History Chief Complaint  Patient presents with   Chest Pain    Roberta Caldwell is a 60 y.o. female.  Patient c/o chest pain since around 2 am this AM. States felt fine when went to bed last nite, but onset 2 am of 'ingestion/heartburn'. States took antacid and went back to bed but symptoms persisted when got up this AM. Symptoms acute onset, at rest, moderate, non radiating, not pleuritic, dull to burning. Notes hx hh/gerd. No hx cad or fam hx premature cad. Denies any exertional cp. No associated nv, diaphoresis or sob. No leg pain or swelling. No hx dvt or pe. No neck or back pain. No abd pain.   The history is provided by the patient.      Past Medical History:  Diagnosis Date   Arthritis    neck and knees   Heart murmur     There are no problems to display for this patient.   Past Surgical History:  Procedure Laterality Date   CESAREAN SECTION     x 2   COLONOSCOPY     x 1   COLONOSCOPY W/ POLYPECTOMY     1st   I & D EXTREMITY Left 01/02/2019   Procedure: Irrigation and debridment with repair reconstruction left middle finger;  Surgeon: Roseanne Kaufman, MD;  Location: [place];  Service: Orthopedics;  Laterality: Left;  60 mins     OB History   No obstetric history on file.     History reviewed. No pertinent family history.  Social History   Tobacco Use   Smoking status: Never   Smokeless tobacco: Never  Substance Use Topics   Alcohol use: Never   Drug use: Never    Home Medications Prior to Admission medications   Medication Sig Start Date End Date Taking? Authorizing Provider  acetaminophen (TYLENOL) 500 MG tablet Take 1,000 mg by mouth every 6 (six) hours as needed for mild pain.   Yes [provider]  cetirizine (ZYRTEC) 10 MG tablet Take 10 mg by mouth daily as needed for allergies or rhinitis. 05/12/20  Yes [provider]  famotidine (PEPCID) 40 MG tablet Take 40 mg by mouth daily as needed for heartburn or indigestion. 05/12/20  Yes [provider]  amoxicillin-clavulanate (AUGMENTIN) 875-125 MG tablet Take 1 tablet by mouth 2 (two) times daily. One po bid x 7 days Patient not taking: Reported on 05/11/2438 10/30/70   Delora Fuel, MD  oxyCODONE-acetaminophen (PERCOCET) 5-325 MG tablet Take 1 tablet by mouth every 4 (four) hours as needed for moderate pain. Patient not taking: Reported on 5/36/6440 34/7/42   Delora Fuel, MD    Allergies    Sulfa antibiotics  Review of Systems   Review of Systems  Constitutional:  Negative for chills and fever.  HENT:  Negative for sore throat.   Eyes:  Negative for redness.  Respiratory:  Negative for cough and shortness of breath.   Cardiovascular:  Positive for chest pain. Negative for palpitations and leg swelling.  Gastrointestinal:  Negative for abdominal pain and vomiting.  Genitourinary:  Negative for flank pain.  Musculoskeletal:  Negative for back pain and neck pain.  Skin:  Negative for rash.  Neurological:  Negative for headaches.  Hematological:  Does not bruise/bleed easily.  Psychiatric/Behavioral:  Negative for confusion.    Physical Exam Updated Vital Signs BP 108/73  Pulse 82   Temp 98.2 F (36.8 C) (Axillary)   Resp (!) 22   SpO2 100%   Physical Exam Vitals and nursing note reviewed.  Constitutional:      Appearance: Normal appearance. She is well-developed.  HENT:     Head: Atraumatic.     Nose: Nose normal.     Mouth/Throat:     Mouth: Mucous membranes are moist.  Eyes:     General: No scleral icterus.    Conjunctiva/sclera: Conjunctivae normal.  Neck:     Trachea: No tracheal deviation.  Cardiovascular:     Rate and Rhythm: Normal rate and regular rhythm.     Pulses: Normal pulses.     Heart sounds: Normal heart sounds. No murmur heard.   No friction rub. No gallop.  Pulmonary:     Effort: Pulmonary  effort is normal. No respiratory distress.     Breath sounds: Normal breath sounds.  Abdominal:     General: Bowel sounds are normal. There is no distension.     Palpations: Abdomen is soft.     Tenderness: There is no abdominal tenderness. There is no guarding.  Genitourinary:    Comments: No cva tenderness.  Musculoskeletal:        General: No swelling.     Cervical back: Normal range of motion and neck supple. No rigidity. No muscular tenderness.     Right lower leg: No edema.     Left lower leg: No edema.  Skin:    General: Skin is warm and dry.     Findings: No rash.  Neurological:     Mental Status: She is alert.     Comments: Alert, speech normal.   Psychiatric:        Mood and Affect: Mood normal.    ED Results / Procedures / Treatments   Labs (all labs ordered are listed, but only abnormal results are displayed) Results for orders placed or performed during the hospital encounter of 08/08/20  Resp Panel by RT-PCR (Flu A&B, Covid) Nasopharyngeal Swab   Specimen: Nasopharyngeal Swab; Nasopharyngeal(NP) swabs in vial transport medium  Result Value Ref Range   SARS Coronavirus 2 by RT PCR POSITIVE (A) NEGATIVE   Influenza A by PCR NEGATIVE NEGATIVE   Influenza B by PCR NEGATIVE NEGATIVE  Basic metabolic panel  Result Value Ref Range   Sodium 134 (L) 135 - 145 mmol/L   Potassium 3.7 3.5 - 5.1 mmol/L   Chloride 103 98 - 111 mmol/L   CO2 24 22 - 32 mmol/L   Glucose, Bld 117 (H) 70 - 99 mg/dL   BUN 14 8 - 23 mg/dL   Creatinine, Ser 0.88 0.44 - 1.00 mg/dL   Calcium 8.8 (L) 8.9 - 10.3 mg/dL   GFR, Estimated >60 >60 mL/min   Anion gap 7 5 - 15  CBC  Result Value Ref Range   WBC 4.4 4.0 - 10.5 K/uL   RBC 3.59 (L) 3.87 - 5.11 MIL/uL   Hemoglobin 10.8 (L) 12.0 - 15.0 g/dL   HCT 34.0 (L) 36.0 - 46.0 %   MCV 94.7 80.0 - 100.0 fL   MCH 30.1 26.0 - 34.0 pg   MCHC 31.8 30.0 - 36.0 g/dL   RDW 13.9 11.5 - 15.5 %   Platelets 239 150 - 400 K/uL   nRBC 0.0 0.0 - 0.2 %   Troponin I (High Sensitivity)  Result Value Ref Range   Troponin I (High Sensitivity) 21 (H) <18 ng/L  Troponin  I (High Sensitivity)  Result Value Ref Range   Troponin I (High Sensitivity) 23 (H) <18 ng/L   DG Chest 2 View  Result Date: 08/08/2020 CLINICAL DATA:  Chest pain EXAM: CHEST - 2 VIEW COMPARISON:  None. FINDINGS: The heart size and mediastinal contours are within normal limits. No focal airspace consolidation, pleural effusion, or pneumothorax. The visualized skeletal structures are unremarkable. IMPRESSION: No active cardiopulmonary disease. Electronically Signed   By: Davina Poke D.O.   On: 08/08/2020 11:36    EKG EKG Interpretation  Date/Time:  Tuesday August 08 2020 10:55:39 EDT Ventricular Rate:  94 PR Interval:  156 QRS Duration: 60 QT Interval:  338 QTC Calculation: 422 R Axis:   36 Text Interpretation: Normal sinus rhythm Nonspecific T wave abnormality No previous tracing Confirmed by Lajean Saver [phone]) on 08/08/2020 1:11:28 PM  Radiology DG Chest 2 View  Result Date: 08/08/2020 CLINICAL DATA:  Chest pain EXAM: CHEST - 2 VIEW COMPARISON:  None. FINDINGS: The heart size and mediastinal contours are within normal limits. No focal airspace consolidation, pleural effusion, or pneumothorax. The visualized skeletal structures are unremarkable. IMPRESSION: No active cardiopulmonary disease. Electronically Signed   By: Davina Poke D.O.   On: 08/08/2020 11:36    Procedures Procedures   Medications Ordered in ED Medications  alum & mag hydroxide-simeth (MAALOX/MYLANTA) 200-200-20 MG/5ML suspension 30 mL (30 mLs Oral Given 08/08/20 1345)  famotidine (PEPCID) tablet 20 mg (20 mg Oral Given 08/08/20 1345)  acetaminophen (TYLENOL) tablet 1,000 mg (1,000 mg Oral Given 08/08/20 1345)    ED Course  I have reviewed the triage vital signs and the nursing notes.  Pertinent labs & imaging results that were available during my care of the patient were reviewed by me and  considered in my medical decision making (see chart for details).    MDM Rules/Calculators/A&P                         Iv ns. Continuous pulse ox and cardiac monitoring. Stat labs. Ecg. Cxr.   Reviewed nursing notes and prior charts for additional history.   Labs reviewed/interpreted by me - initial and delta trop 21/23, not increasing.  Covid is positive. Pt notes spouse recently positive. Pt is vaccinated, denies cough or sob, no fever/chills.   On additional rechecks, pt denies any chest pain or discomfort, no sob, and indicates feels ready for d/c.   CXR reviewed/interpreted by me - no pna.   Rec outpt cardiology f/u.  Return precautions provided.      Final Clinical Impression(s) / ED Diagnoses Final diagnoses:  None    Rx / DC Orders ED Discharge Orders     None        Lajean Saver, MD 08/08/20 1616

## 2020-08-08 NOTE — ED Triage Notes (Signed)
Patient arrived by [facility] from home complaining of reflux symptoms since 6am. Patient took GERD meds with no relief. Patient received aspirin and 1 SL NG with some relief. Patient had BP drop and received 200ns following nitro

## 2020-10-11 ENCOUNTER — Ambulatory Visit: Payer: 59 | Admitting: Podiatry

## 2020-10-12 ENCOUNTER — Ambulatory Visit: Payer: 59 | Admitting: Podiatry

## 2020-10-12 ENCOUNTER — Encounter: Payer: Self-pay | Admitting: Podiatry

## 2020-10-12 ENCOUNTER — Other Ambulatory Visit: Payer: Self-pay

## 2020-10-12 VITALS — BP 114/74 | HR 81 | Temp 97.9°F

## 2020-10-12 DIAGNOSIS — L84 Corns and callosities: Secondary | ICD-10-CM | POA: Diagnosis not present

## 2020-10-12 DIAGNOSIS — Q828 Other specified congenital malformations of skin: Secondary | ICD-10-CM

## 2020-10-12 DIAGNOSIS — M778 Other enthesopathies, not elsewhere classified: Secondary | ICD-10-CM

## 2020-10-12 MED ORDER — TRIAMCINOLONE ACETONIDE 10 MG/ML IJ SUSP
10.0000 mg | Freq: Once | INTRAMUSCULAR | Status: AC
  Administered 2020-10-12: 10 mg

## 2020-10-12 NOTE — Progress Notes (Signed)
Subjective:   Patient ID: Roberta Caldwell, female   DOB: 60 y.o.   MRN: [id number]   HPI Patient states she has a lot of pain in the outside of the right foot the second digit and has 3 lesions which become very tender and make walking difficult.  Patient states that it is gotten worse recently especially the second toe and the other is a been there longer.  Patient does not smoke likes to be active   Review of Systems  All other systems reviewed and are negative.      Objective:  Physical Exam Vitals and nursing note reviewed.  Constitutional:      Appearance: She is well-developed.  Pulmonary:     Effort: Pulmonary effort is normal.  Musculoskeletal:        General: Normal range of motion.  Skin:    General: Skin is warm.  Neurological:     Mental Status: She is alert.    Neurovascular status found to be intact muscle strength was found to be adequate with inflammation of the second digit plantar right and fifth MPJ right with fluid buildup and 3 separate lesions with the lesions plantar showing lucent line course.  Patient has good digital perfusion well oriented x3     Assessment:  Inflammatory condition with inflammation of the plantar second digit right fifth MPJ and lesions which may be verruca or may be porokeratotic in nature or a combination     Plan:  H&P reviewed conditions did careful steroid injection around the fifth MPJ right plantar second digit into the plantar capsule debrided all lesions and discussed with the digit to become symptomatic quickly I would consider excision of the lesion as it may be verruca plantaris.  Reappoint for [place] to recheck and I educated patient on possibility for fifth metatarsal osteotomy for head resection

## 2021-03-25 NOTE — Progress Notes (Signed)
Cardiology Office Note:    Date:  03/26/2021   ID:  Roberta Caldwell, DOB 1960-12-21, MRN [id number]  PCP:  Shirline Frees, MD   [facility] Providers Cardiologist:  Lenna Sciara, MD Referring MD: Shirline Frees, MD   Chief Complaint/Reason for Referral:  Chest pain  ASSESSMENT:    Precordial pain  Hyperlipidemia, unspecified hyperlipidemia type    PLAN:    In order of problems listed above:  Likely related to [place].  She is doing very well, exercises on a regular basis and has no exertional angina.  She has no symptoms or signs suggestive of LV dysfunction.  Her last echocardiogram showed normal function.  No further evaluation is necessary.  We will keep follow-up with me open-ended.  Continue current therapy; being managed by PCP.             Dispo:  Return if symptoms worsen or fail to improve.     Medication Adjustments/Labs and Tests Ordered: Current medicines are reviewed at length with the patient today.  Concerns regarding medicines are outlined above.   Tests Ordered: No orders of the defined types were placed in this encounter.   Medication Changes: No orders of the defined types were placed in this encounter.   History of Present Illness:    FOCUSED PROBLEM LIST:   COVID infection 7/22 Hyperlipidemia  The patient is a 61 y.o. female with the indicated medical history here recommendations regarding chest pain.  The patient contracted COVID in July 2022 and presented to the ED for chest pain that was not associated with exertion.  Her HS-trops were mildly elevated.  EKG was reassuring.  Her chest discomfort resolved and she was discharged home.  She saw her PCP recently and was doing well.  She was referred for further recommendations regarding this chest pain episode.  The patient exercises on a routine basis.  She denies any chest pain with this.  She exercises 3 times a week.  She has had no other episodes of shortness of breath.  She does have  issues with indigestion.  She does have a history of a hiatal hernia.  She has required no emergency room visits or hospitalizations.  She is otherwise well without significant complaints.  Notably she has had no long COVID symptoms.        Previous Medical History: Past Medical History:  Diagnosis Date   Anemia    Arthritis    neck and knees   GERD (gastroesophageal reflux disease)    Heart murmur    Hypercholesterolemia    Iron deficiency    Personal history of colonic polyps    Seasonal allergic rhinitis due to pollen    Tremor    Vitamin D deficiency      Current Medications: Current Meds  Medication Sig   acetaminophen (TYLENOL) 500 MG tablet Take 1,000 mg by mouth every 6 (six) hours as needed for mild pain.   cetirizine (ZYRTEC) 10 MG tablet Take 10 mg by mouth daily as needed for allergies or rhinitis.   famotidine (PEPCID) 40 MG tablet Take 40 mg by mouth daily as needed for heartburn or indigestion.     Allergies:    Sulfa antibiotics   Social History:   Social History   Tobacco Use   Smoking status: Never   Smokeless tobacco: Never  Substance Use Topics   Alcohol use: Never   Drug use: Never     Family Hx: No family history on file.   Review  of Systems:   Please see the history of present illness.    All other systems reviewed and are negative.     EKGs/Labs/Other Test Reviewed:    EKG:  in ED NSR with NSTT  Prior CV studies:  TTE 2019 - Left ventricle: The cavity size was normal. Systolic function was    normal. The estimated ejection fraction was in the range of 60%    to 65%. Wall motion was normal; there were no regional wall    motion abnormalities. Doppler parameters are consistent with    abnormal left ventricular relaxation (grade 1 diastolic    dysfunction).  - Aortic valve: There was trivial regurgitation.  - Mitral valve: There was no regurgitation.  - Right ventricle: The cavity size was normal. Wall thickness was    normal.  Systolic function was normal.  - Right atrium: The atrium was normal in size.  - Pulmonary arteries: Systolic pressure was within the normal    range.  - Inferior vena cava: The vessel was normal in size.  - Pericardium, extracardiac: There was no pericardial effusion.   Imaging studies that I have independently reviewed today: CXR, TTE  Recent Labs: 08/08/2020: BUN 14; Creatinine, Ser 0.88; Hemoglobin 10.8; Platelets 239; Potassium 3.7; Sodium 134   Recent Lipid Panel No results found for: CHOL, TRIG, HDL, LDLCALC, LDLDIRECT  Risk Assessment/Calculations:          Physical Exam:    VS:  BP 110/70 (BP Location: Left Arm, Patient Position: Sitting, Cuff Size: Normal)    Pulse 78    Ht 5\' 3"  (1.6 m)    Wt 110 lb (49.9 kg)    BMI 19.49 kg/m    Wt Readings from Last 3 Encounters:  03/26/21 110 lb (49.9 kg)  01/02/19 115 lb (52.2 kg)  12/29/18 120 lb (54.4 kg)    GENERAL:  No apparent distress, AOx3 HEENT:  No carotid bruits, +2 carotid impulses, no scleral icterus CAR: RRR no murmurs, gallops, rubs, or thrills RES:  Clear to auscultation bilaterally ABD:  Soft, nontender, nondistended, positive bowel sounds x 4 VASC:  +2 radial pulses, +2 carotid pulses, palpable pedal pulses NEURO:  CN 2-12 grossly intact; motor and sensory grossly intact PSYCH:  No active depression or anxiety EXT:  No edema, ecchymosis, or cyanosis  Signed, Early Osmond, MD  03/26/2021 9:27 AM    [address], [place], [place]  [address] Phone: [phone]; Fax: [phone]   Note:  This document was prepared using Dragon voice recognition software and may include unintentional dictation errors.

## 2021-03-26 ENCOUNTER — Ambulatory Visit: Payer: 59 | Admitting: Internal Medicine

## 2021-03-26 ENCOUNTER — Other Ambulatory Visit: Payer: Self-pay

## 2021-03-26 ENCOUNTER — Encounter: Payer: Self-pay | Admitting: Internal Medicine

## 2021-03-26 VITALS — BP 110/70 | HR 78 | Ht 63.0 in | Wt 110.0 lb

## 2021-03-26 DIAGNOSIS — R072 Precordial pain: Secondary | ICD-10-CM

## 2021-03-26 DIAGNOSIS — E785 Hyperlipidemia, unspecified: Secondary | ICD-10-CM

## 2021-03-26 NOTE — Patient Instructions (Signed)
Medication Instructions:  NO CHANGES *If you need a refill on your cardiac medications before your next appointment, please call your pharmacy*   Lab Work: NONE If you have labs (blood work) drawn today and your tests are completely normal, you will receive your results only by: [place] (if you have MyChart) OR A paper copy in the mail If you have any lab test that is abnormal or we need to change your treatment, we will call you to review the results.   Testing/Procedures: NONE   Follow-Up: At [facility], you and your health needs are our priority.  As part of our continuing mission to provide you with exceptional heart care, we have created designated Provider Care Teams.  These Care Teams include your primary Cardiologist (physician) and Advanced Practice Providers (APPs -  Physician Assistants and Nurse Practitioners) who all work together to provide you with the care you need, when you need it.  We recommend signing up for the patient portal called "MyChart".  Sign up information is provided on this After Visit Summary.  MyChart is used to connect with patients for Virtual Visits (Telemedicine).  Patients are able to view lab/test results, encounter notes, upcoming appointments, etc.  Non-urgent messages can be sent to your provider as well.   To learn more about what you can do with MyChart, go to [email or web address].    Your next appointment:   AS NEEDED

## 2021-06-27 ENCOUNTER — Telehealth: Payer: Self-pay | Admitting: *Deleted

## 2021-06-27 NOTE — Telephone Encounter (Signed)
Pt agreeable to plan of care for tele pre op appt 07/30/21 @ 9:20 per pt request for time slot. Med rec and consent are done.

## 2021-06-27 NOTE — Telephone Encounter (Signed)
   Pre-operative Risk Assessment    Patient Name: Roberta Caldwell  DOB: [birth year] MRN: [id number]      Request for Surgical Clearance    Procedure:   COLONOSCOPY; H/O ADENOMATOUS POLYPS OF THE COLON  Date of Surgery:  Clearance 09/18/21                                 Surgeon:  DR. MARC MAGOD Surgeon's Group or Practice Name:  [facility] Phone number:  [phone] Fax number:  [phone]   Type of Clearance Requested:   - Medical ; NO MEDICATIONS BEING REQUESTED TO BE HELD   Type of Anesthesia:   PROPOFOL   Additional requests/questions:    Jiles Prows   06/27/2021, 9:58 AM

## 2021-06-27 NOTE — Telephone Encounter (Signed)
Pt agreeable to plan of care for tele pre op appt 07/30/21 @ 9:20 per pt request for time slot. Med rec and consent are done.     Patient Consent for Virtual Visit        Roberta Caldwell has provided verbal consent on 06/27/2021 for a virtual visit (video or telephone).   CONSENT FOR VIRTUAL VISIT FOR:  Roberta Caldwell  By participating in this virtual visit I agree to the following:  I hereby voluntarily request, consent and authorize [facility] and its employed or contracted physicians, physician assistants, nurse practitioners or other licensed health care professionals (the Practitioner), to provide me with telemedicine health care services (the "Services") as deemed necessary by the treating Practitioner. I acknowledge and consent to receive the Services by the Practitioner via telemedicine. I understand that the telemedicine visit will involve communicating with the Practitioner through live audiovisual communication technology and the disclosure of certain medical information by electronic transmission. I acknowledge that I have been given the opportunity to request an in-person assessment or other available alternative prior to the telemedicine visit and am voluntarily participating in the telemedicine visit.  I understand that I have the right to withhold or withdraw my consent to the use of telemedicine in the course of my care at any time, without affecting my right to future care or treatment, and that the Practitioner or I may terminate the telemedicine visit at any time. I understand that I have the right to inspect all information obtained and/or recorded in the course of the telemedicine visit and may receive copies of available information for a reasonable fee.  I understand that some of the potential risks of receiving the Services via telemedicine include:  Delay or interruption in medical evaluation due to technological equipment failure or disruption; Information transmitted may not  be sufficient (e.g. poor resolution of images) to allow for appropriate medical decision making by the Practitioner; and/or  In rare instances, security protocols could fail, causing a breach of personal health information.  Furthermore, I acknowledge that it is my responsibility to provide information about my medical history, conditions and care that is complete and accurate to the best of my ability. I acknowledge that Practitioner's advice, recommendations, and/or decision may be based on factors not within their control, such as incomplete or inaccurate data provided by me or distortions of diagnostic images or specimens that may result from electronic transmissions. I understand that the practice of medicine is not an exact science and that Practitioner makes no warranties or guarantees regarding treatment outcomes. I acknowledge that a copy of this consent can be made available to me via my patient portal ([place]), or I can request a printed copy by calling the office of [facility].    I understand that my insurance will be billed for this visit.   I have read or had this consent read to me. I understand the contents of this consent, which adequately explains the benefits and risks of the Services being provided via telemedicine.  I have been provided ample opportunity to ask questions regarding this consent and the Services and have had my questions answered to my satisfaction. I give my informed consent for the services to be provided through the use of telemedicine in my medical care

## 2021-07-30 ENCOUNTER — Ambulatory Visit (INDEPENDENT_AMBULATORY_CARE_PROVIDER_SITE_OTHER): Payer: 59 | Admitting: Nurse Practitioner

## 2021-07-30 DIAGNOSIS — Z01810 Encounter for preprocedural cardiovascular examination: Secondary | ICD-10-CM | POA: Diagnosis not present

## 2021-07-30 NOTE — Progress Notes (Signed)
Virtual Visit via Telephone Note   Because of Roberta Caldwell's co-morbid illnesses, she is at least at moderate risk for complications without adequate follow up.  This format is felt to be most appropriate for this patient at this time.  The patient did not have access to video technology/had technical difficulties with video requiring transitioning to audio format only (telephone).  All issues noted in this document were discussed and addressed.  No physical exam could be performed with this format.  Please refer to the patient's chart for her consent to telehealth for [facility].  Evaluation Performed:  Preoperative cardiovascular risk assessment _____________   Date:  07/30/2021   Patient ID:  Roberta Caldwell, DOB Apr 18, 1960, MRN [id number] Patient Location:  Home Provider location:   Office  Primary Care Provider:  Shirline Frees, MD Primary Cardiologist:  Early Osmond, MD  Chief Complaint / Patient Profile   61 y.o. y/o female with a h/o precordial chest pain, hyperlipidemia, GERD, iron deficiency anemia and colon polyps who is pending colonoscopy with Dr. Clarene Essex with [facility] on 09/18/2021 and presents today for telephonic preoperative cardiovascular risk assessment.  Past Medical History    Past Medical History:  Diagnosis Date   Anemia    Arthritis    neck and knees   GERD (gastroesophageal reflux disease)    Heart murmur    Hypercholesterolemia    Iron deficiency    Personal history of colonic polyps    Seasonal allergic rhinitis due to pollen    Tremor    Vitamin D deficiency    Past Surgical History:  Procedure Laterality Date   CESAREAN SECTION     x 2   COLONOSCOPY     x 1   COLONOSCOPY W/ POLYPECTOMY     1st   I & D EXTREMITY Left 01/02/2019   Procedure: Irrigation and debridment with repair reconstruction left middle finger;  Surgeon: Roseanne Kaufman, MD;  Location: [place];  Service: Orthopedics;  Laterality: Left;  60 mins     Allergies  Allergies  Allergen Reactions   Sulfa Antibiotics Rash    History of Present Illness    [name] is a 61 y.o. female who presents via audio/video conferencing for a telehealth visit today.  Pt was last seen in cardiology clinic on 03/26/2021 by Dr. Ali Lowe.  At that time DONI BACHA was doing well from a cardiac standpoint. The patient is now pending procedure as outlined above. Since her last visit, she has done well from a cardiac standpoint.  She is active and cares for her 60-year-old grandson, additionally, she participates in an exercise class and walks 30 to 45 minutes a day. She denies chest pain, palpitations, dyspnea, pnd, orthopnea, n, v, dizziness, syncope, edema, weight gain, or early satiety. All other systems reviewed and are otherwise negative except as noted above.   Home Medications    Prior to Admission medications   Medication Sig Start Date End Date Taking? Authorizing Provider  acetaminophen (TYLENOL) 500 MG tablet Take 1,000 mg by mouth every 6 (six) hours as needed for mild pain.    [provider]  cetirizine (ZYRTEC) 10 MG tablet Take 10 mg by mouth daily as needed for allergies or rhinitis. 05/12/20   [provider]  cholecalciferol (VITAMIN D3) 25 MCG (1000 UNIT) tablet Take 1,000 Units by mouth daily.    [provider]  famotidine (PEPCID) 40 MG tablet Take 40 mg by  mouth daily as needed for heartburn or indigestion. 05/12/20   [provider]  ferrous sulfate 325 (65 FE) MG tablet Take 325 mg by mouth daily with breakfast.    [provider]  Multiple Vitamins-Minerals (MULTI FOR HER) TABS Take 1 tablet by mouth daily.    [provider]    Physical Exam    Vital Signs:  Roberta Caldwell does not have vital signs available for review today.  Given telephonic nature of communication, physical exam is limited. AAOx3. NAD. Normal affect.  Speech and respirations are unlabored.  Accessory  Clinical Findings    None  Assessment & Plan    1.  Preoperative Cardiovascular Risk Assessment:  According to the Revised Cardiac Risk Index (RCRI), her Perioperative Risk of Major Cardiac Event is (%): 0.4. Her Functional Capacity in METs is: 8.33 according to the Duke Activity Status Index (DASI). Therefore, based on ACC/AHA guidelines, patient would be at acceptable risk for the planned procedure without further cardiovascular testing.    A copy of this note will be routed to requesting surgeon.  Time:   Today, I have spent 6 minutes with the patient with telehealth technology discussing medical history, symptoms, and management plan.     Lenna Sciara, NP  07/30/2021, 9:20 AM

## 2022-03-21 ENCOUNTER — Encounter: Payer: Self-pay | Admitting: Podiatry

## 2022-03-21 ENCOUNTER — Ambulatory Visit: Payer: 59 | Admitting: Podiatry

## 2022-03-21 DIAGNOSIS — M7751 Other enthesopathy of right foot: Secondary | ICD-10-CM | POA: Diagnosis not present

## 2022-03-21 DIAGNOSIS — M79671 Pain in right foot: Secondary | ICD-10-CM

## 2022-03-21 MED ORDER — TRIAMCINOLONE ACETONIDE 10 MG/ML IJ SUSP
10.0000 mg | Freq: Once | INTRAMUSCULAR | Status: AC
  Administered 2022-03-21: 10 mg

## 2022-03-21 NOTE — Progress Notes (Signed)
Subjective:   Patient ID: Roberta Caldwell, female   DOB: 62 y.o.   MRN: [id number]   HPI Patient presents with inflammation underneath the right second toe fluid buildup lesion formation plantar   ROS      Objective:  Physical Exam  Inflammatory capsulitis of the right second toe in the plantar area with inflammation fluid buildup lesion     Assessment:  Lesion secondary to bone pressure with inflammatory plantar capsulitis     Plan:  Education rendered sterile prep injected the plantar capsule 3 mg Dexasone Kenalog debrided lesion applied cushion reappoint as symptoms indicate may require surgery if symptoms persist

## 2022-12-04 ENCOUNTER — Other Ambulatory Visit: Payer: Self-pay | Admitting: Obstetrics and Gynecology

## 2022-12-04 DIAGNOSIS — R9230 Dense breasts, unspecified: Secondary | ICD-10-CM

## 2023-05-29 ENCOUNTER — Ambulatory Visit
Admission: RE | Admit: 2023-05-29 | Discharge: 2023-05-29 | Disposition: A | Discharge status: Home / Self Care | Payer: Self-pay | Source: Ambulatory Visit | Attending: Obstetrics and Gynecology | Admitting: Obstetrics and Gynecology

## 2023-05-29 DIAGNOSIS — R9230 Dense breasts, unspecified: Secondary | ICD-10-CM

## 2023-05-29 MED ORDER — GADOPICLENOL 0.5 MMOL/ML IV SOLN
5.0000 mL | Freq: Once | INTRAVENOUS | Status: AC | PRN
  Administered 2023-05-29: 5 mL via INTRAVENOUS

## 2023-06-02 ENCOUNTER — Ambulatory Visit: Payer: Self-pay

## 2023-06-27 IMAGING — DX DG CHEST 2V
2 series · 2 of 2 positions shown · non-contrast
Comparison: None.

CLINICAL DATA: Chest pain

EXAM:
CHEST - 2 VIEW

[chest pa]
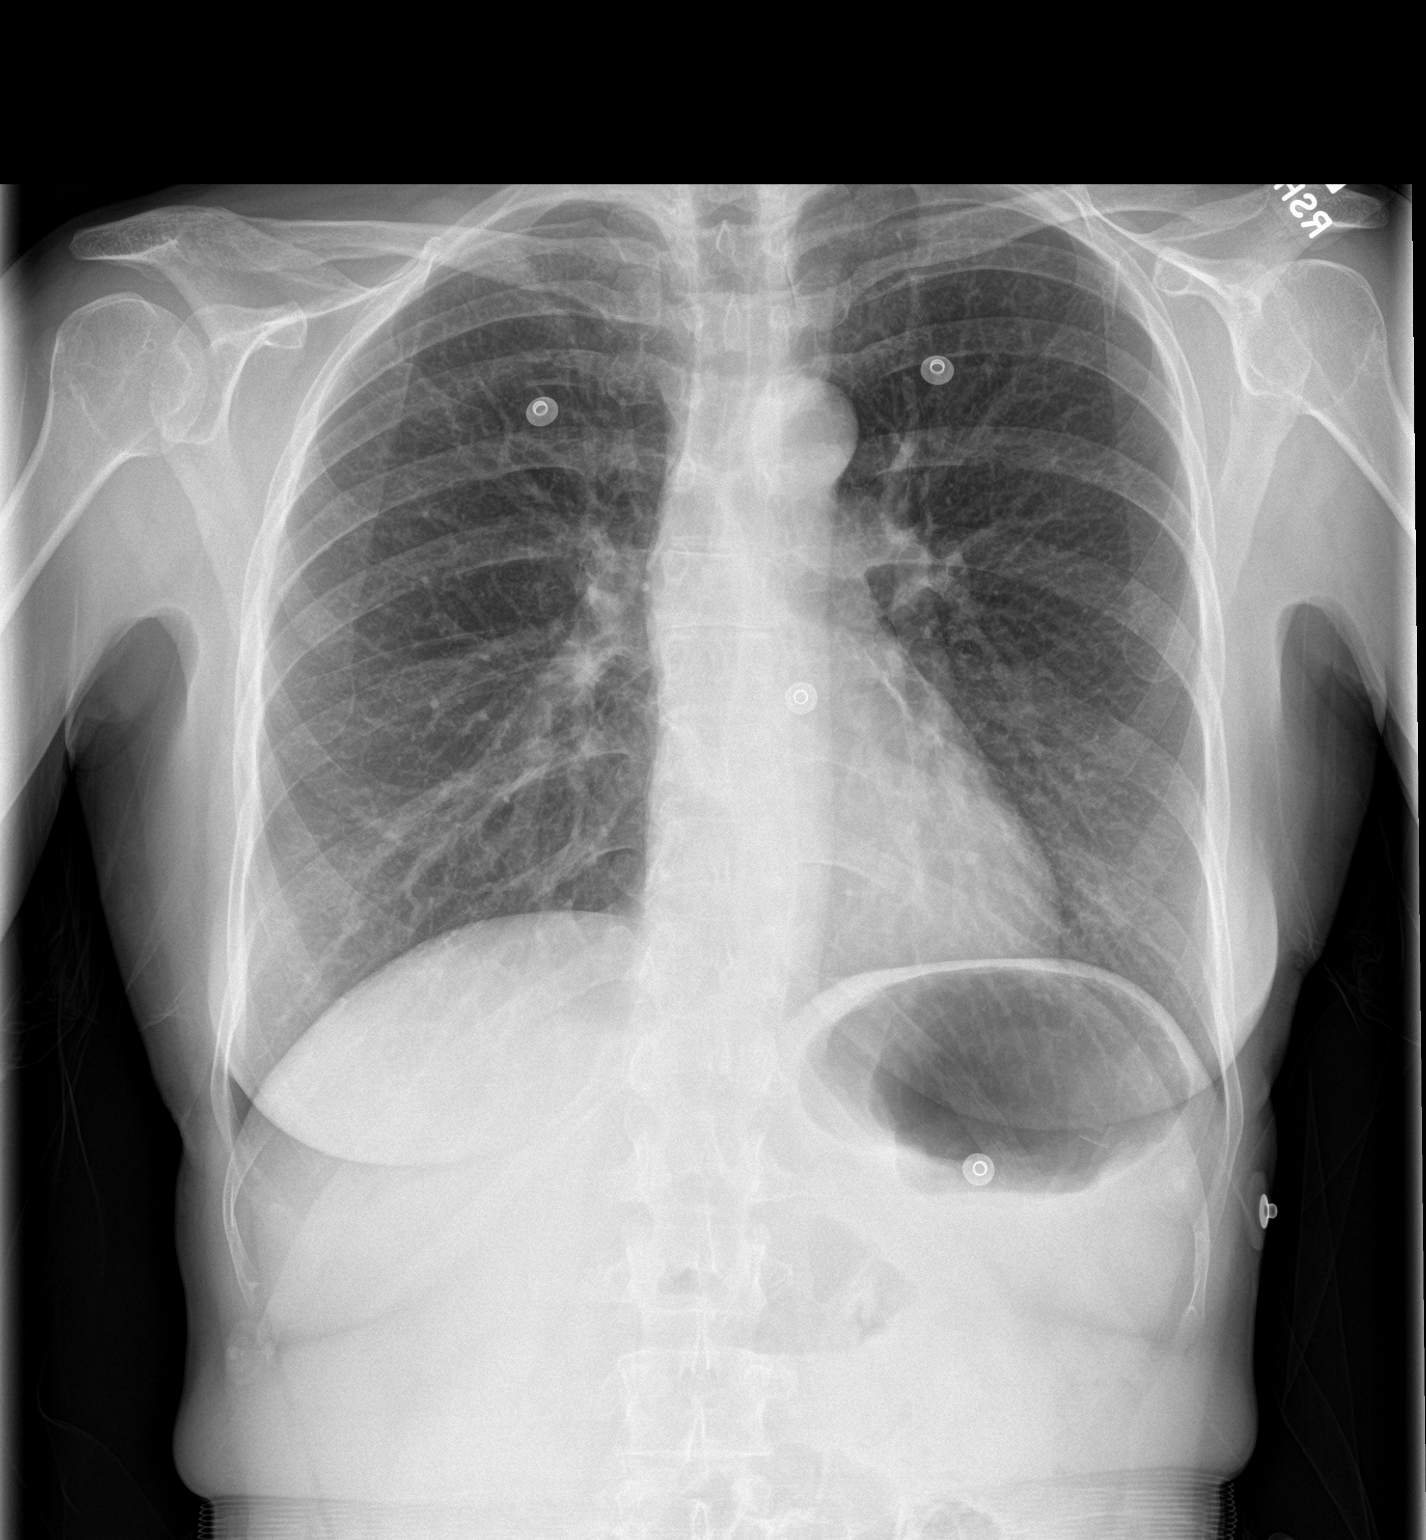

[chest lat]
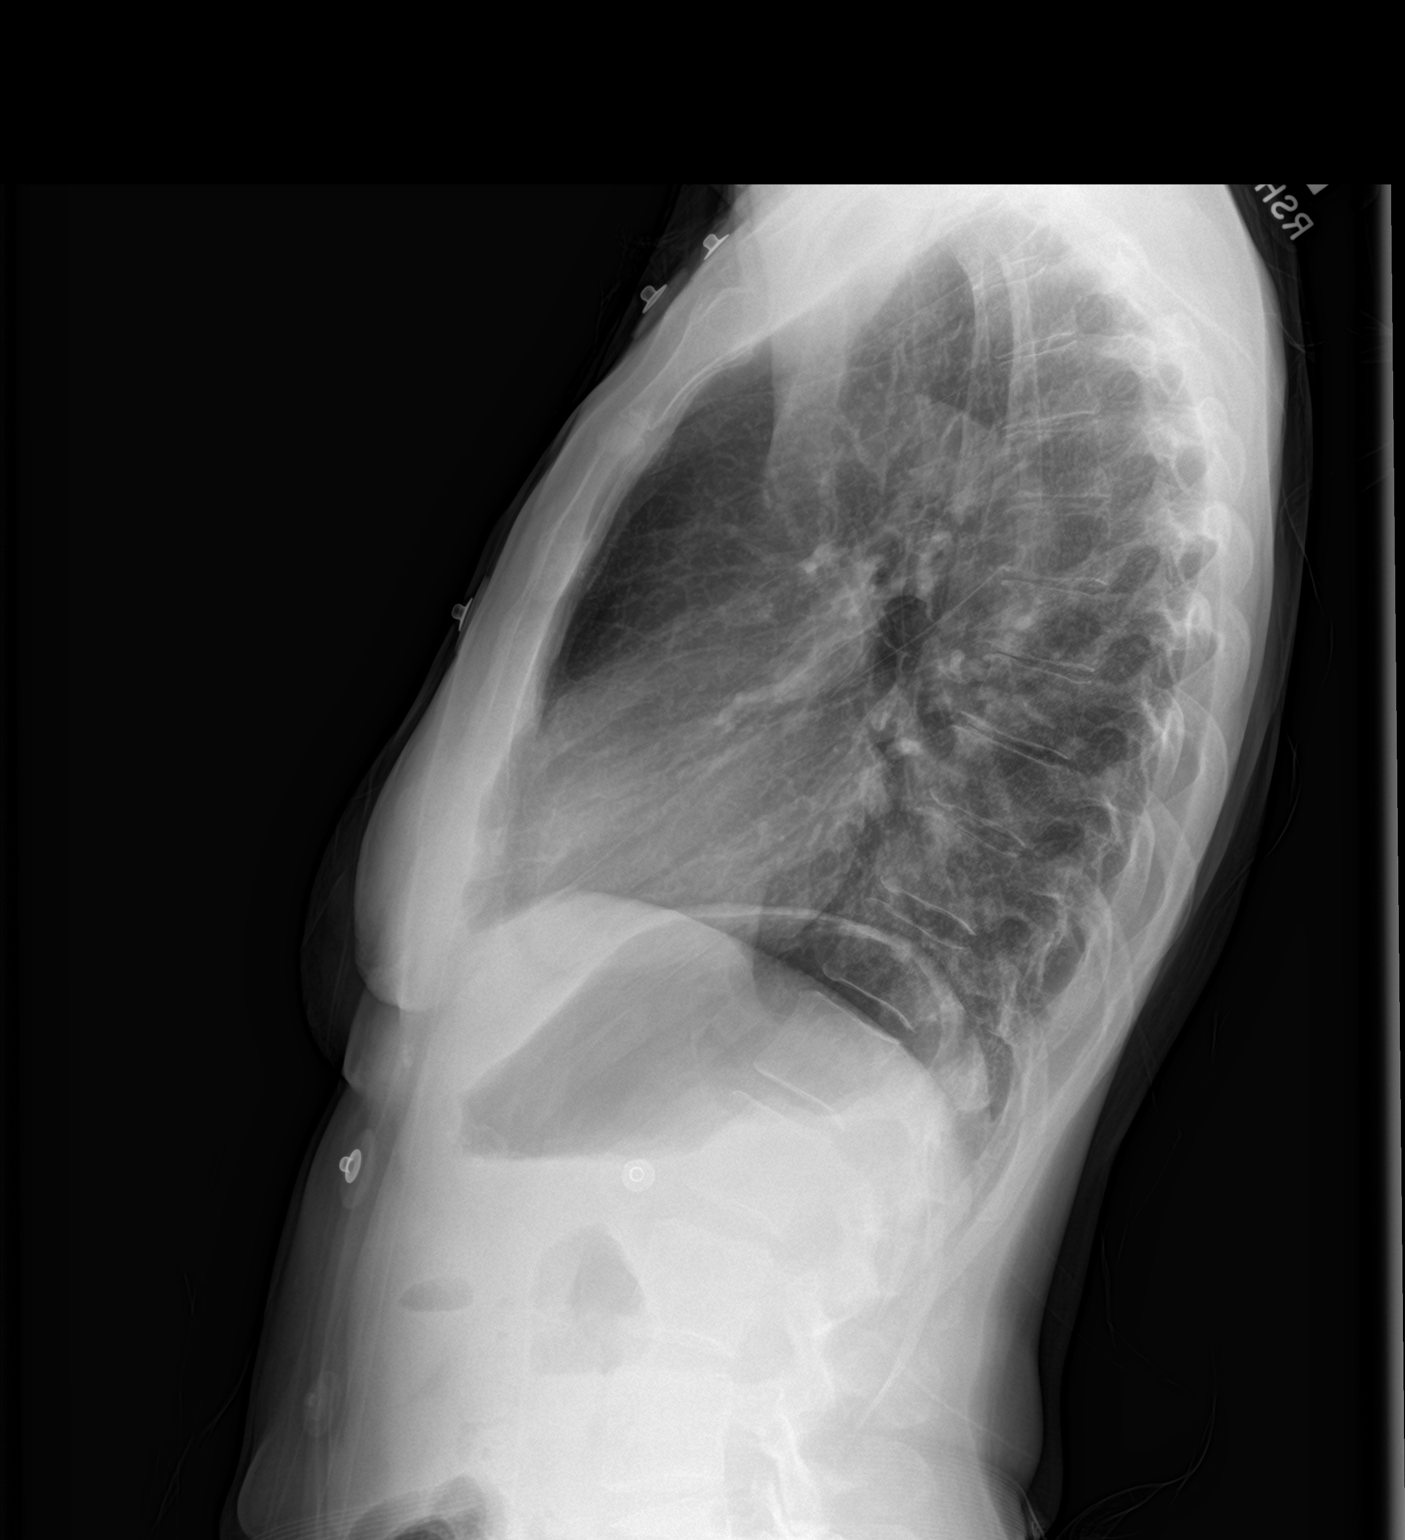

[2 of 2 positions shown; findings below may reference images not displayed]

FINDINGS: The heart size and mediastinal contours are within normal limits. No
focal airspace consolidation, pleural effusion, or pneumothorax. The
visualized skeletal structures are unremarkable.
IMPRESSION: No active cardiopulmonary disease.

## 2023-09-01 ENCOUNTER — Encounter (INDEPENDENT_AMBULATORY_CARE_PROVIDER_SITE_OTHER): Payer: Self-pay | Admitting: Physician Assistant

## 2023-09-01 ENCOUNTER — Ambulatory Visit (INDEPENDENT_AMBULATORY_CARE_PROVIDER_SITE_OTHER): Payer: Self-pay | Admitting: Physician Assistant

## 2023-09-01 ENCOUNTER — Ambulatory Visit (INDEPENDENT_AMBULATORY_CARE_PROVIDER_SITE_OTHER): Payer: Self-pay | Admitting: Audiology

## 2023-09-01 VITALS — BP 120/75 | HR 65 | Ht 63.0 in | Wt 110.0 lb

## 2023-09-01 DIAGNOSIS — H9319 Tinnitus, unspecified ear: Secondary | ICD-10-CM | POA: Diagnosis not present

## 2023-09-01 DIAGNOSIS — Z0110 Encounter for examination of ears and hearing without abnormal findings: Secondary | ICD-10-CM | POA: Diagnosis not present

## 2023-09-01 DIAGNOSIS — J358 Other chronic diseases of tonsils and adenoids: Secondary | ICD-10-CM

## 2023-09-01 DIAGNOSIS — H9313 Tinnitus, bilateral: Secondary | ICD-10-CM

## 2023-09-01 NOTE — Progress Notes (Addendum)
 Dear Dr. Arloa, Here is my assessment for our mutual patient, Roberta Caldwell. Thank you for allowing me the opportunity to care for your patient. Please do not hesitate to contact me should you have any other questions. Sincerely, Chyrl Cohen PA-C  Otolaryngology Clinic Note Referring provider: Dr. Arloa HPI:  Roberta Caldwell is a 63 y.o. female kindly referred by Dr. Arloa   The patient is a 63 year old female seen in our office for evaluation of tinnitus, vertigo, enlarged tonsil.  The patient notes that approximately 7 years ago she had an episode of BPPV.  She notes since that time she has been asymptomatic.  At the beginning in June she rolled over in bed and felt the room spinning.  She denied any associated ringing or hearing loss, she did note nausea.  She notes this felt similar to her previous episode of BPPV.  She notes this was worse when rolling onto her left side.  She went to urgent care, she was given meclizine and Zofran .  She had persistent symptoms with moving her head, she eventually called her primary care physician who put her on scopolamine and prednisone.  She notes she started doing vestibular exercises that she had previously been given.  After 5 days of doing these the symptoms completely resolved.  She notes that she has been asymptomatic since that time.  She denies any associated neurologic deficits, no complaints related to dizziness or vertigo today.  She notes a longstanding history of tonal tinnitus bilateral ears, right may be worse than the left.  She notes this is not bothersome, usually only hears it at night and quiet environment, does not keep her up.  She denies any associated hearing loss, no pain in the ears.  She notes she was told by her dentist that her left tonsil was larger when compared to the right.  She notes she has had this for approximately 10 years with no significant changes.  She denies any episodes of recurrent infections, no pain, no change in  voice, no difficulty swallowing.  She denies any large lymph nodes in her neck, no neck pain, no fever, night sweats, or weight loss.  She has been monitoring herself and notes no significant changes to the tonsil.  No history of smoking.   Independent Review of Additional Tests or Records:  PCP office visit note 06/29/2023  Vertigo, tonsillar cyst   Audiological evaluation 09/01/2023    PMH/Meds/All/SocHx/FamHx/ROS:   Past Medical History:  Diagnosis Date   Anemia    Arthritis    neck and knees   GERD (gastroesophageal reflux disease)    Heart murmur    Hypercholesterolemia    Iron deficiency    Personal history of colonic polyps    Seasonal allergic rhinitis due to pollen    Tremor    Vitamin D deficiency      Past Surgical History:  Procedure Laterality Date   CESAREAN SECTION     x 2   COLONOSCOPY     x 1   COLONOSCOPY W/ POLYPECTOMY     1st   I & D EXTREMITY Left 01/02/2019   Procedure: Irrigation and debridment with repair reconstruction left middle finger;  Surgeon: Camella Fallow, MD;  Location: [facility];  Service: Orthopedics;  Laterality: Left;  60 mins    No family history on file.   Social Connections: Not on file      Current Outpatient Medications:    acetaminophen  (TYLENOL ) 500 MG tablet, Take 1,000 mg by  mouth every 6 (six) hours as needed for mild pain., Disp: , Rfl:    cetirizine (ZYRTEC) 10 MG tablet, Take 10 mg by mouth daily as needed for allergies or rhinitis., Disp: , Rfl:    cholecalciferol (VITAMIN D3) 25 MCG (1000 UNIT) tablet, Take 1,000 Units by mouth daily., Disp: , Rfl:    famotidine  (PEPCID ) 40 MG tablet, Take 40 mg by mouth daily as needed for heartburn or indigestion., Disp: , Rfl:    ferrous sulfate 325 (65 FE) MG tablet, Take 325 mg by mouth daily with breakfast., Disp: , Rfl:    Multiple Vitamins-Minerals (MULTI FOR HER) TABS, Take 1 tablet by mouth daily., Disp: , Rfl:    Physical Exam:   There were no vitals taken for this  visit.  Pertinent Findings  CN II-XII intact Bilateral EAC clear and TM intact with well pneumatized middle ear spaces Anterior rhinoscopy: Septum midline; bilateral inferior turbinates with no hypertrophy No lesions of oral cavity/oropharynx; dentition in normal limits, right tonsil barely visible behind the tonsillar pillar, left tonsil 1+, no erythema, no abnormal tissue No obviously palpable neck masses/lymphadenopathy/thyromegaly No respiratory distress or stridor  Seprately Identifiable Procedures:  None  Impression & Plans:  Roberta Caldwell is a 63 y.o. female with the following   Tinnitus-  Tonal tinnitus with no red flags, this is not bothersome to her, no intervention indicated at this time.  If it changes or any new or concerning characteristics she will reach out.  Vertigo-  Symptoms are most consistent with BPPV, the symptoms have resolved with PT.  I have advised her to reach back out to our office if she develops recurrence of the symptoms, she would likely benefit from formal PT as this may help her get back to normal faster.  I discussed concerning findings if they present I would like her to call the office, she verbalized understanding and agreement to this.  Asymmetric tonsils-  She has asymmetric tonsils on exam today.  She notes it has been this way for approximately 10 years with no significant changes.  She has no systemic symptoms, again given no significant changes low suspicion for any malignancy.  I did advise her to continue monitoring this closely, if she noticed any significant changes or symptoms at like her to reach back to our office for follow-up evaluation.  The patient is happy with today's plan, she verbalized understanding and agreement to today's plan had no further questions or concerns.   - f/u PRN   Thank you for allowing me the opportunity to care for your patient. Please do not hesitate to contact me should you have any other  questions.  Sincerely, Chyrl Cohen PA-C  [facility] Phone: [phone] Fax: [phone]  09/01/2023, 8:58 AM

## 2023-09-01 NOTE — Progress Notes (Signed)
  [address] [place], [place] [address] [phone]  Audiological Evaluation    Name: Roberta Caldwell     DOB:   02/06/1960      MRN:   [id number]                                                                                     Service Date: 09/01/2023     Accompanied by: unaccompanied   Patient comes today after Reyes Cohen, PA-C sent a referral for a hearing evaluation due to concerns with dizziness.   Symptoms Yes Details  Hearing loss  []    Tinnitus  [x]  Comes and goes, maybe right side is more  Ear pain/ infections/pressure  [x]  Right ear felt stuffier with the vertigo  Balance problems  [x]  Many years ago had vertigo associated with position changes. This June 2025 had vertigo lasting minutes that felt stronger. Per her PCP completed Brandt-Daroff exercises and now is clear - it would happen when she laid in her left  side.  Noise exposure history  []    Previous ear surgeries  []    Family history of hearing loss  []    Amplification  []    Other  []      Otoscopy: Right ear: Clear external ear canal and notable landmarks visualized on the tympanic membrane. Left ear:  Clear external ear canal and notable landmarks visualized on the tympanic membrane.  Tympanometry: Right ear: Type A- Normal external ear canal volume with normal middle ear pressure and tympanic membrane compliance. Left ear: Type A- Normal external ear canal volume with normal middle ear pressure and tympanic membrane compliance.  Pure tone Audiometry: Right ear- Normal hearing from [phone] Hz.  Left ear-  Normal hearing from [phone] Hz.   Speech Audiometry: Right ear- Speech Reception Threshold (SRT) was obtained at 5 dBHL. Left ear-Speech Reception Threshold (SRT) was obtained at 5 dBHL.   Word Recognition Score Tested using NU-6 (recorded) Right ear: 100% was obtained at a presentation level of 50 dBHL with contralateral masking which is deemed as  excellent. Left ear: 100% was  obtained at a presentation level of 50 dBHL with contralateral masking which is deemed as  excellent.   The hearing test results were completed under headphones and results are deemed to be of good reliability. Test technique:  conventional      Recommendations: Follow up with ENT as scheduled for today. Return for a hearing evaluation if concerns with hearing changes arise or per MD recommendation.   Nivin Braniff MARIE LEROUX-MARTINEZ, AUD
